# Patient Record
Sex: Female | Born: 1940 | Race: White | Hispanic: No | Marital: Married | Smoking: Never smoker
Health system: Southern US, Community
[De-identification: ages and names within clinical notes are randomized; demographics above are authoritative.]

## PROBLEM LIST (undated history)

## (undated) DIAGNOSIS — I10 Essential (primary) hypertension: Secondary | ICD-10-CM

## (undated) DIAGNOSIS — K08109 Complete loss of teeth, unspecified cause, unspecified class: Secondary | ICD-10-CM

## (undated) DIAGNOSIS — N950 Postmenopausal bleeding: Secondary | ICD-10-CM

## (undated) DIAGNOSIS — Z87410 Personal history of cervical dysplasia: Secondary | ICD-10-CM

## (undated) DIAGNOSIS — Z9221 Personal history of antineoplastic chemotherapy: Secondary | ICD-10-CM

## (undated) DIAGNOSIS — Z923 Personal history of irradiation: Secondary | ICD-10-CM

## (undated) DIAGNOSIS — Z853 Personal history of malignant neoplasm of breast: Secondary | ICD-10-CM

## (undated) DIAGNOSIS — Z972 Presence of dental prosthetic device (complete) (partial): Secondary | ICD-10-CM

## (undated) HISTORY — DX: Personal history of malignant neoplasm of breast: Z85.3

---

## 1963-10-30 HISTORY — PX: CYST REMOVAL NECK: SHX6281

## 1987-10-30 HISTORY — PX: CERVICAL CONE BIOPSY: SUR198

## 1999-09-29 ENCOUNTER — Encounter: Payer: Self-pay | Admitting: Obstetrics and Gynecology

## 1999-09-29 ENCOUNTER — Encounter: Admission: RE | Admit: 1999-09-29 | Discharge: 1999-09-29 | Payer: Self-pay | Admitting: Obstetrics and Gynecology

## 1999-10-05 ENCOUNTER — Encounter: Admission: RE | Admit: 1999-10-05 | Discharge: 1999-10-05 | Payer: Self-pay | Admitting: Obstetrics and Gynecology

## 1999-10-05 ENCOUNTER — Encounter: Payer: Self-pay | Admitting: Obstetrics and Gynecology

## 1999-10-17 ENCOUNTER — Encounter (INDEPENDENT_AMBULATORY_CARE_PROVIDER_SITE_OTHER): Payer: Self-pay | Admitting: Specialist

## 1999-10-17 ENCOUNTER — Encounter: Payer: Self-pay | Admitting: *Deleted

## 1999-10-17 ENCOUNTER — Ambulatory Visit (HOSPITAL_COMMUNITY): Admission: RE | Admit: 1999-10-17 | Discharge: 1999-10-17 | Payer: Self-pay | Admitting: *Deleted

## 1999-10-30 DIAGNOSIS — C50919 Malignant neoplasm of unspecified site of unspecified female breast: Secondary | ICD-10-CM

## 1999-10-30 DIAGNOSIS — Z923 Personal history of irradiation: Secondary | ICD-10-CM

## 1999-10-30 DIAGNOSIS — Z9221 Personal history of antineoplastic chemotherapy: Secondary | ICD-10-CM

## 1999-10-30 HISTORY — DX: Personal history of antineoplastic chemotherapy: Z92.21

## 1999-10-30 HISTORY — PX: BREAST LUMPECTOMY WITH AXILLARY LYMPH NODE DISSECTION: SHX5756

## 1999-10-30 HISTORY — PX: BREAST EXCISIONAL BIOPSY: SUR124

## 1999-10-30 HISTORY — PX: BREAST LUMPECTOMY: SHX2

## 1999-10-30 HISTORY — DX: Personal history of irradiation: Z92.3

## 1999-10-30 HISTORY — DX: Malignant neoplasm of unspecified site of unspecified female breast: C50.919

## 1999-10-31 ENCOUNTER — Encounter (INDEPENDENT_AMBULATORY_CARE_PROVIDER_SITE_OTHER): Payer: Self-pay | Admitting: *Deleted

## 1999-10-31 ENCOUNTER — Ambulatory Visit (HOSPITAL_COMMUNITY): Admission: RE | Admit: 1999-10-31 | Discharge: 1999-10-31 | Payer: Self-pay | Admitting: *Deleted

## 1999-10-31 ENCOUNTER — Encounter: Payer: Self-pay | Admitting: *Deleted

## 1999-11-10 ENCOUNTER — Encounter: Admission: RE | Admit: 1999-11-10 | Discharge: 2000-02-08 | Payer: Self-pay | Admitting: *Deleted

## 1999-11-21 ENCOUNTER — Encounter: Payer: Self-pay | Admitting: Hematology & Oncology

## 1999-11-21 ENCOUNTER — Encounter: Admission: RE | Admit: 1999-11-21 | Discharge: 1999-11-21 | Payer: Self-pay | Admitting: Hematology & Oncology

## 1999-12-04 ENCOUNTER — Encounter: Payer: Self-pay | Admitting: Hematology & Oncology

## 1999-12-04 ENCOUNTER — Emergency Department (HOSPITAL_COMMUNITY): Admission: EM | Admit: 1999-12-04 | Discharge: 1999-12-04 | Payer: Self-pay | Admitting: Emergency Medicine

## 2000-02-12 ENCOUNTER — Encounter: Admission: RE | Admit: 2000-02-12 | Discharge: 2000-05-12 | Payer: Self-pay | Admitting: *Deleted

## 2000-04-29 ENCOUNTER — Encounter: Payer: Self-pay | Admitting: *Deleted

## 2000-04-29 ENCOUNTER — Encounter: Admission: RE | Admit: 2000-04-29 | Discharge: 2000-04-29 | Payer: Self-pay | Admitting: Family Medicine

## 2000-11-01 ENCOUNTER — Encounter: Admission: RE | Admit: 2000-11-01 | Discharge: 2000-11-01 | Payer: Self-pay | Admitting: Hematology & Oncology

## 2000-11-01 ENCOUNTER — Encounter: Payer: Self-pay | Admitting: Hematology & Oncology

## 2001-03-18 ENCOUNTER — Encounter: Admission: RE | Admit: 2001-03-18 | Discharge: 2001-03-18 | Payer: Self-pay | Admitting: *Deleted

## 2001-03-18 ENCOUNTER — Encounter: Payer: Self-pay | Admitting: *Deleted

## 2001-04-04 ENCOUNTER — Encounter (INDEPENDENT_AMBULATORY_CARE_PROVIDER_SITE_OTHER): Payer: Self-pay | Admitting: *Deleted

## 2001-04-04 ENCOUNTER — Ambulatory Visit (HOSPITAL_BASED_OUTPATIENT_CLINIC_OR_DEPARTMENT_OTHER): Admission: RE | Admit: 2001-04-04 | Discharge: 2001-04-04 | Payer: Self-pay | Admitting: *Deleted

## 2001-04-04 HISTORY — PX: OTHER SURGICAL HISTORY: SHX169

## 2001-11-10 ENCOUNTER — Encounter: Payer: Self-pay | Admitting: *Deleted

## 2001-11-10 ENCOUNTER — Encounter: Admission: RE | Admit: 2001-11-10 | Discharge: 2001-11-10 | Payer: Self-pay | Admitting: *Deleted

## 2002-01-09 ENCOUNTER — Ambulatory Visit (HOSPITAL_BASED_OUTPATIENT_CLINIC_OR_DEPARTMENT_OTHER): Admission: RE | Admit: 2002-01-09 | Discharge: 2002-01-09 | Payer: Self-pay | Admitting: *Deleted

## 2002-04-24 ENCOUNTER — Encounter: Payer: Self-pay | Admitting: Hematology & Oncology

## 2002-04-24 ENCOUNTER — Ambulatory Visit (HOSPITAL_COMMUNITY): Admission: RE | Admit: 2002-04-24 | Discharge: 2002-04-24 | Payer: Self-pay | Admitting: Hematology & Oncology

## 2002-11-13 ENCOUNTER — Encounter: Payer: Self-pay | Admitting: *Deleted

## 2002-11-13 ENCOUNTER — Encounter: Admission: RE | Admit: 2002-11-13 | Discharge: 2002-11-13 | Payer: Self-pay | Admitting: *Deleted

## 2003-11-19 ENCOUNTER — Encounter: Admission: RE | Admit: 2003-11-19 | Discharge: 2003-11-19 | Payer: Self-pay | Admitting: Obstetrics and Gynecology

## 2004-11-20 ENCOUNTER — Encounter: Admission: RE | Admit: 2004-11-20 | Discharge: 2004-11-20 | Payer: Self-pay | Admitting: Hematology & Oncology

## 2005-02-02 ENCOUNTER — Ambulatory Visit: Payer: Self-pay | Admitting: Hematology & Oncology

## 2005-08-03 ENCOUNTER — Ambulatory Visit: Payer: Self-pay | Admitting: Hematology & Oncology

## 2005-11-23 ENCOUNTER — Encounter: Admission: RE | Admit: 2005-11-23 | Discharge: 2005-11-23 | Payer: Self-pay | Admitting: Hematology & Oncology

## 2005-12-07 ENCOUNTER — Encounter: Admission: RE | Admit: 2005-12-07 | Discharge: 2005-12-07 | Payer: Self-pay | Admitting: Hematology & Oncology

## 2006-08-01 ENCOUNTER — Ambulatory Visit: Payer: Self-pay | Admitting: Hematology & Oncology

## 2006-12-30 ENCOUNTER — Encounter: Admission: RE | Admit: 2006-12-30 | Discharge: 2006-12-30 | Payer: Self-pay | Admitting: Hematology & Oncology

## 2007-07-31 ENCOUNTER — Ambulatory Visit: Payer: Self-pay | Admitting: Hematology & Oncology

## 2007-08-04 LAB — CBC WITH DIFFERENTIAL/PLATELET
BASO%: 0.5 % (ref 0.0–2.0)
EOS%: 2.2 % (ref 0.0–7.0)
Eosinophils Absolute: 0.1 10*3/uL (ref 0.0–0.5)
HCT: 39.7 % (ref 34.8–46.6)
LYMPH%: 36.8 % (ref 14.0–48.0)
MCHC: 34.6 g/dL (ref 32.0–36.0)
MONO%: 8.5 % (ref 0.0–13.0)
NEUT%: 52 % (ref 39.6–76.8)
Platelets: 263 10*3/uL (ref 145–400)
RBC: 4.13 10*6/uL (ref 3.70–5.32)

## 2007-08-04 LAB — COMPREHENSIVE METABOLIC PANEL
ALT: 16 U/L (ref 0–35)
AST: 16 U/L (ref 0–37)
CO2: 23 mEq/L (ref 19–32)
Chloride: 109 mEq/L (ref 96–112)
Potassium: 4.4 mEq/L (ref 3.5–5.3)
Total Bilirubin: 0.4 mg/dL (ref 0.3–1.2)
Total Protein: 6.9 g/dL (ref 6.0–8.3)

## 2008-01-02 ENCOUNTER — Encounter: Admission: RE | Admit: 2008-01-02 | Discharge: 2008-01-02 | Payer: Self-pay | Admitting: Obstetrics and Gynecology

## 2008-09-16 ENCOUNTER — Ambulatory Visit: Payer: Self-pay | Admitting: Hematology & Oncology

## 2008-09-17 LAB — CBC WITH DIFFERENTIAL (CANCER CENTER ONLY)
BASO%: 0.7 % (ref 0.0–2.0)
EOS%: 2.5 % (ref 0.0–7.0)
HCT: 40.3 % (ref 34.8–46.6)
HGB: 13.7 g/dL (ref 11.6–15.9)
LYMPH%: 37.1 % (ref 14.0–48.0)
MCV: 98 fL (ref 81–101)
MONO%: 7.4 % (ref 0.0–13.0)
NEUT%: 52.3 % (ref 39.6–80.0)
Platelets: 258 10*3/uL (ref 145–400)
RBC: 4.12 10*6/uL (ref 3.70–5.32)
WBC: 4.3 10*3/uL (ref 3.9–10.0)

## 2008-09-17 LAB — COMPREHENSIVE METABOLIC PANEL
Alkaline Phosphatase: 65 U/L (ref 39–117)
CO2: 24 mEq/L (ref 19–32)
Chloride: 107 mEq/L (ref 96–112)
Glucose, Bld: 100 mg/dL — ABNORMAL HIGH (ref 70–99)
Sodium: 140 mEq/L (ref 135–145)

## 2009-01-03 ENCOUNTER — Encounter: Admission: RE | Admit: 2009-01-03 | Discharge: 2009-01-03 | Payer: Self-pay | Admitting: Obstetrics and Gynecology

## 2009-09-15 ENCOUNTER — Ambulatory Visit: Payer: Self-pay | Admitting: Hematology & Oncology

## 2009-09-16 LAB — CBC WITH DIFFERENTIAL (CANCER CENTER ONLY)
BASO#: 0 10*3/uL (ref 0.0–0.2)
BASO%: 0.3 % (ref 0.0–2.0)
LYMPH#: 1.5 10*3/uL (ref 0.9–3.3)
MCH: 32.7 pg (ref 26.0–34.0)
MCHC: 34.1 g/dL (ref 32.0–36.0)
MCV: 96 fL (ref 81–101)
NEUT#: 2.1 10*3/uL (ref 1.5–6.5)
Platelets: 256 10*3/uL (ref 145–400)
WBC: 3.9 10*3/uL (ref 3.9–10.0)

## 2009-09-17 LAB — COMPREHENSIVE METABOLIC PANEL
ALT: 15 U/L (ref 0–35)
Alkaline Phosphatase: 59 U/L (ref 39–117)
Chloride: 108 mEq/L (ref 96–112)
Glucose, Bld: 79 mg/dL (ref 70–99)
Potassium: 4.2 mEq/L (ref 3.5–5.3)

## 2009-09-17 LAB — VITAMIN D 25 HYDROXY (VIT D DEFICIENCY, FRACTURES): Vit D, 25-Hydroxy: 17 ng/mL — ABNORMAL LOW (ref 30–89)

## 2009-09-27 LAB — HM PAP SMEAR: HM Pap smear: NORMAL

## 2010-01-06 ENCOUNTER — Encounter: Admission: RE | Admit: 2010-01-06 | Discharge: 2010-01-06 | Payer: Self-pay | Admitting: Obstetrics and Gynecology

## 2010-09-13 ENCOUNTER — Ambulatory Visit: Payer: Self-pay | Admitting: Hematology & Oncology

## 2010-09-15 LAB — CBC WITH DIFFERENTIAL (CANCER CENTER ONLY)
BASO#: 0 10*3/uL (ref 0.0–0.2)
EOS%: 6.2 % (ref 0.0–7.0)
Eosinophils Absolute: 0.2 10*3/uL (ref 0.0–0.5)
MCH: 32.8 pg (ref 26.0–34.0)
MCV: 98 fL (ref 81–101)
NEUT#: 2.3 10*3/uL (ref 1.5–6.5)
RBC: 4.13 10*6/uL (ref 3.70–5.32)

## 2010-09-16 LAB — COMPREHENSIVE METABOLIC PANEL
CO2: 25 mEq/L (ref 19–32)
Chloride: 106 mEq/L (ref 96–112)
Creatinine, Ser: 0.75 mg/dL (ref 0.40–1.20)
Total Bilirubin: 0.6 mg/dL (ref 0.3–1.2)

## 2010-12-06 ENCOUNTER — Other Ambulatory Visit: Payer: Self-pay | Admitting: Obstetrics and Gynecology

## 2010-12-06 ENCOUNTER — Other Ambulatory Visit: Payer: Self-pay | Admitting: Hematology & Oncology

## 2010-12-06 DIAGNOSIS — Z1231 Encounter for screening mammogram for malignant neoplasm of breast: Secondary | ICD-10-CM

## 2010-12-06 DIAGNOSIS — Z9889 Other specified postprocedural states: Secondary | ICD-10-CM

## 2011-01-12 ENCOUNTER — Ambulatory Visit
Admission: RE | Admit: 2011-01-12 | Discharge: 2011-01-12 | Disposition: A | Discharge status: Home / Self Care | Payer: Medicare HMO | Source: Ambulatory Visit | Attending: Obstetrics and Gynecology | Admitting: Obstetrics and Gynecology

## 2011-01-12 DIAGNOSIS — Z9889 Other specified postprocedural states: Secondary | ICD-10-CM

## 2011-01-12 DIAGNOSIS — Z1231 Encounter for screening mammogram for malignant neoplasm of breast: Secondary | ICD-10-CM

## 2011-01-12 LAB — HM MAMMOGRAPHY: HM Mammogram: NORMAL

## 2011-03-16 NOTE — Consult Note (Signed)
[place]. [facility]  Patient:    ARAEYA LAMB                      MRN: [id number] Proc. Date: 12/05/99 Adm. Date:  [id number] Attending:  Osvaldo Human                          Consultation Report  REASON FOR VISIT: 1. Fever. 2. Neutropenia. 3. Recent chemotherapy for breast cancer.  HISTORY OF PRESENT ILLNESS:  Ms. Dake is a 70 year old white female with breast cancer.  She has a stage I breast cancer.  She recently had chemotherapy two weeks ago.  She got epirubicin/Cytoxan.  She did quite well with chemotherapy.  Over the past day or so, she began to feel a little nauseated.  She had a little bit of a headache.  She had some slight chills.  There were no shakes.  She had no cough or shortness of breath.  She had a sore throat.  She had no actual vomiting. There was no diarrhea.  There was no dysuria.  She had a temperature of 101 on the day of December 04, 1999.  She called the office.  She was called in a prescription for Augmentin.  She started the Augmentin.  She continued to have some temperatures.  Her husband called me this evening and I told her to come to the emergency room.  While in the emergency room, she had a temperature of 101.  PHYSICAL EXAMINATION:  GENERAL:  Relatively unremarkable.  VITAL SIGNS:  Stable.  Blood pressure was 132/78, respirations 16, and her heart rate was 88.  HEENT:  Her throat was slightly erythematous.  There was no exudate.  NECK:  Supple with a good range of motion.  No adenopathies noted in her neck.   LUNGS:  Clear bilaterally.  CARDIAC:  Regular rate and rhythm with no murmurs, rubs, or bruits.  ABDOMEN:  No hepatosplenomegaly.  EXTREMITIES:  No clubbing, cyanosis, or edema.  SKIN:  No rashes, ecchymoses, or petechiae.  NEUROLOGIC:  No focal neurologic deficits.  LABORATORY DATA:  Showed a white cell count of 1500 with an ANC of approximately 600.  Her monocytes, however,  were 29%, which is encouraging.  H&H was stable.   Chest x-ray was negative for pneumonia.  IMPRESSION:  Ms. Wojnar is a 70 year old female with history of breast cancer. he underwent chemotherapy two weeks ago.  She is now mildly neutropenic.  I think we can let her go home.  I think we need to ride out the fever.  The white cells should be recovering real quickly.  I do not think she has a bacterial infection.  This certainly may be a viral process.  I will continue her on the Augmentin.  I will give her some Magic Mouthwash to [facility] for her sore throat, and also give her some Darvocet to take for her headache. DD:  12/05/99 TD:  12/05/99 Job: [id number]

## 2011-03-16 NOTE — Op Note (Signed)
[place]. [facility]  Patient:    Destiny Taylor, Destiny Taylor                     MRN: [id number] Proc. Date: 04/04/01 Adm. Date:  [id number] Attending:  Kandis Mannan CC:         Katherine Roan, M.D.   Operative Report  CCS NUMBER:  [id number]  PREOPERATIVE DIAGNOSIS:   Nipple retraction right breast with history of carcinoma, right breast treated by lumpectomy, radiation and sentinel left node dissection.  POSTOPERATIVE DIAGNOSIS:  Nipple retraction right breast with history of carcinoma, right breast treated by lumpectomy, radiation and sentinel left node dissection.  OPERATION:  Excision of a retroareolar tissue, right breast.  SURGEON:  Maisie Fus B. Samuella Cota, M.D.  ANESTHESIA:  1% Xylocaine local with anesthesia monitoring.  ANESTHESIOLOGIST:  CRNA  DESCRIPTION OF PROCEDURE:  The patient was taken to the operating room and placed on the table in the supine position.  The right breast was prepped and draped in a sterile field.  A circumareolar incision was outlined from about 7 oclock to 11 oclock.  Then 1% Xylocaine local was used to infiltrate the skin and underlying breast tissue.  A curved incision was made and then the tissue beneath the nipple and areola was dissected free from the areola.  The tissue just below the incision and towards the axillary tail was also excised. A small amount of tissue was removed so that the tissue which was adjacent to the undersurface of the nipple was excised and the nipple was no longer inverted.  Bleeding was controlled with the cautery.  No abnormal tissue was felt.  There was no mass in this area.  Subcutaneous tissue was closed with interrupted sutures of 3-0 Vicryl.  The skin was closed with a running subcuticular suture of 4-0 Vicryl reinforced with Benzoin and 1/2 inch Steri-Strips.  A pressure dressing using 4 x 4s, ABD and 4 inch Hypafix was applied.  The specimen was sent fresh to the pathologist. DD:   04/04/01 TD:  04/05/01 Job: [id number]

## 2011-05-10 ENCOUNTER — Encounter: Payer: Self-pay | Admitting: Family Medicine

## 2011-05-10 DIAGNOSIS — Z853 Personal history of malignant neoplasm of breast: Secondary | ICD-10-CM | POA: Insufficient documentation

## 2011-08-31 ENCOUNTER — Ambulatory Visit: Payer: Medicare HMO | Admitting: Gynecology

## 2011-10-04 ENCOUNTER — Telehealth: Payer: Self-pay | Admitting: Hematology & Oncology

## 2011-10-04 NOTE — Telephone Encounter (Signed)
Pt had 2 apts on 12/7 and 12/18.  Pt kept 12/7 apt

## 2011-10-05 ENCOUNTER — Other Ambulatory Visit (HOSPITAL_BASED_OUTPATIENT_CLINIC_OR_DEPARTMENT_OTHER): Payer: Medicare HMO | Admitting: Lab

## 2011-10-05 ENCOUNTER — Ambulatory Visit (HOSPITAL_BASED_OUTPATIENT_CLINIC_OR_DEPARTMENT_OTHER): Payer: Medicare HMO | Admitting: Hematology & Oncology

## 2011-10-05 DIAGNOSIS — M810 Age-related osteoporosis without current pathological fracture: Secondary | ICD-10-CM

## 2011-10-05 DIAGNOSIS — Z853 Personal history of malignant neoplasm of breast: Secondary | ICD-10-CM

## 2011-10-05 LAB — CBC WITH DIFFERENTIAL (CANCER CENTER ONLY)
BASO#: 0 10*3/uL (ref 0.0–0.2)
EOS%: 1.8 % (ref 0.0–7.0)
Eosinophils Absolute: 0.1 10*3/uL (ref 0.0–0.5)
HGB: 13.4 g/dL (ref 11.6–15.9)
LYMPH%: 29.4 % (ref 14.0–48.0)
MCH: 33.6 pg (ref 26.0–34.0)
MCHC: 34.1 g/dL (ref 32.0–36.0)
MONO#: 0.4 10*3/uL (ref 0.1–0.9)
NEUT#: 2.6 10*3/uL (ref 1.5–6.5)
RBC: 3.99 10*6/uL (ref 3.70–5.32)
WBC: 4.4 10*3/uL (ref 3.9–10.0)

## 2011-10-05 LAB — COMPREHENSIVE METABOLIC PANEL
ALT: 13 U/L (ref 0–35)
Albumin: 4.2 g/dL (ref 3.5–5.2)
Alkaline Phosphatase: 70 U/L (ref 39–117)
BUN: 16 mg/dL (ref 6–23)
Chloride: 106 mEq/L (ref 96–112)
Total Protein: 7.2 g/dL (ref 6.0–8.3)

## 2011-10-05 NOTE — Progress Notes (Signed)
This office note has been dictated.

## 2011-10-05 NOTE — Progress Notes (Signed)
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  CURRENT THERAPY:  Observation.  INTERIM HISTORY:  Ms. Destiny Taylor comes in for her yearly followup.  She has had a good year.  Unfortunately, her sister has bladder cancer.  She is being treated at [facility].  She is not doing too well.  Ms. Destiny Taylor and her family did go to [place] in May.  They had a good time down there.  Ms. Destiny Taylor is developing "nodules" on the palms of her hands.  This may be from all of her piano playing.  I suppose that these are probably areas of tendon hypertrophy.  She does not have any contractions of her fingers at this point in time.  Her last mammogram was done back in March.  Everything looked good with the mammogram.  She has had no bony pain.  Her vitamin D was low from what she tells me. She is on 2000 units a day now.  There is no change in bowel or bladder habits.  She has not had any problems with rashes.  When we last saw her, she had a rash.  This resolved.  PHYSICAL EXAM:  General:  This is a well-developed well-nourished white female in no obvious distress.  Vital Signs:  Temperature of 98, pulse 63, respiratory rate 20, blood pressure 148/84.  Weight is 142. Head/Neck:  Exam shows a normocephalic, atraumatic skull.  There are no ocular or oral lesions.  There are no palpable cervical or supraclavicular lymph nodes.  Lungs:  Clear bilaterally.  Cardiac: Regular rate and rhythm with a normal S1, S2.  There are no murmurs, rubs or bruits.  Breasts:  Exam shows the left breast with no masses, edema or erythema.  There is no left axillary adenopathy.  The right breast shows a well-healed lumpectomy at the 10 o'clock position.  There is a slight nipple inversion of the right breast.  No distinct masses noted in the right breast.  There is no right axillary adenopathy. Abdomen:  Soft with good bowel sounds.  There is no palpable abdominal mass.  There is no fluid wave.  There is no  palpable hepatosplenomegaly. Back:  Exam shows no kyphosis.  No tenderness is noted over the spine, ribs, or hips.  Extremities:  No clubbing, cyanosis or edema.  She has had a couple nodules in the palms of her hands.  Again, these appear to be areas of tendon hypertrophy.  LABORATORY STUDIES:  White cell count is 4.4, hemoglobin 13.4, hematocrit 39.3, platelet count 242.  IMPRESSION:  Destiny Taylor is a 70 year old white female with a history of stage I ductal carcinoma of the right breast.  She completed chemotherapy back in March 2001.  She had adjuvant radiation therapy. She then was on Arimidex for 5 years.  I think Ms. Jefferys's risk of recurrence is going to be less than 5%.  She likes to come back to see [place] yearly.  We will get her back to see [place] yearly.  I told Ms. Kilpatrick that if she started to note that her fingers were starting to contract, she likely will need to go to an orthopedic surgeon for evaluation and possibly release of these tendons.    ______________________________ Josph Macho, M.D. PRE/MEDQ  D:  10/05/2011  T:  10/05/2011  Job:  [id number]

## 2011-10-15 ENCOUNTER — Ambulatory Visit: Payer: Medicare HMO | Admitting: Hematology & Oncology

## 2011-10-15 ENCOUNTER — Other Ambulatory Visit: Payer: Medicare HMO | Admitting: Lab

## 2011-10-16 ENCOUNTER — Ambulatory Visit: Payer: Medicare HMO | Admitting: Hematology & Oncology

## 2011-10-16 ENCOUNTER — Other Ambulatory Visit: Payer: Medicare HMO | Admitting: Lab

## 2011-12-07 ENCOUNTER — Other Ambulatory Visit: Payer: Self-pay | Admitting: Obstetrics and Gynecology

## 2011-12-07 DIAGNOSIS — Z1231 Encounter for screening mammogram for malignant neoplasm of breast: Secondary | ICD-10-CM

## 2011-12-10 ENCOUNTER — Other Ambulatory Visit: Payer: Self-pay | Admitting: Hematology & Oncology

## 2011-12-10 DIAGNOSIS — Z1231 Encounter for screening mammogram for malignant neoplasm of breast: Secondary | ICD-10-CM

## 2012-01-14 ENCOUNTER — Ambulatory Visit: Payer: Medicare HMO

## 2012-01-14 ENCOUNTER — Ambulatory Visit
Admission: RE | Admit: 2012-01-14 | Discharge: 2012-01-14 | Disposition: A | Discharge status: Home / Self Care | Payer: Medicare HMO | Source: Ambulatory Visit | Attending: Hematology & Oncology | Admitting: Hematology & Oncology

## 2012-01-14 DIAGNOSIS — Z1231 Encounter for screening mammogram for malignant neoplasm of breast: Secondary | ICD-10-CM

## 2012-02-18 ENCOUNTER — Ambulatory Visit (INDEPENDENT_AMBULATORY_CARE_PROVIDER_SITE_OTHER): Payer: Medicare HMO | Admitting: Gynecology

## 2012-02-18 ENCOUNTER — Encounter: Payer: Self-pay | Admitting: Gynecology

## 2012-02-18 DIAGNOSIS — M810 Age-related osteoporosis without current pathological fracture: Secondary | ICD-10-CM

## 2012-02-18 DIAGNOSIS — N952 Postmenopausal atrophic vaginitis: Secondary | ICD-10-CM

## 2012-02-18 DIAGNOSIS — C50919 Malignant neoplasm of unspecified site of unspecified female breast: Secondary | ICD-10-CM

## 2012-02-18 NOTE — Patient Instructions (Signed)
Follow up for DEXA bone study as scheduled and blood work results. Otherwise follow up in one year for annual follow up exam.

## 2012-02-18 NOTE — Progress Notes (Signed)
Destiny Taylor 08/31/1941 [id number]        71 y.o.  G2 P2 new patient for follow up. Former patient of Dr. Algis Taylor. [facility].  Past medical history,surgical history, medications, allergies, family history and social history were all reviewed and documented in the EPIC chart. ROS:  Was performed and pertinent positives and negatives are included in the history.  Exam: Destiny Taylor chaperone present Filed Vitals:   02/18/12 1350  BP: 120/74   General appearance  Normal Skin grossly normal Head/Neck normal with no cervical or supraclavicular adenopathy thyroid normal Lungs  clear Cardiac RR, without RMG Abdominal  soft, nontender, without masses, organomegaly or hernia Breasts  examined lying and sitting.  Left without masses, retractions, discharge or axillary adenopathy.  Right status post lumpectomy/radiation changes. No acute changes, masses, discharge, adenopathy Pelvic  Ext/BUS/vagina  normal with atrophic genital changes  Cervix  normal  Atrophic changes  Uterus  axial, normal size, shape and contour, midline and mobile nontender   Adnexa  Without masses or tenderness    Anus and perineum  normal   Rectovaginal  normal sphincter tone without palpated masses or tenderness.    Assessment/Plan:  71 y.o. female for annual exam.    1. Breast cancer history. Patient has history of right ductal breast cancer status post lumpectomy/chemotherapy/radiation 2001. Has done well since without evidence of recurrence. She receives annual mammography as follow up. Just had her mammogram in March. We'll continue with annual mammography. SBE monthly reviewed. 2. Pap smear. Last Pap smear 2012. 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 No Pap smear was done today. Options to stop it altogether her doing less frequent screening reviewed and will readdress this on an annual basis. 3. Osteoporosis. Patient has a history of osteoporosis with  last DEXA I saw in 2002 showing a -2.9 T score at the lumbar spine. She gives a history of a DEXA 4 years ago. She is chronically low on her vitamin D and currently is taking 5000 units daily. Had discussed treatment with Dr. Lelon Taylor to include bisphosphonates but she declined. We'll check vitamin D today along with PTH, calcium and repeat DEXA. She has a history of a recent thyroid screen through her primary physician's office which was normal. She'll follow up with me after the DEXA and we'll discuss treatment options. 4. Colonoscopy. Patient is up-to-date with her colon screening and we'll continue to follow up to her primary's office. 5. Health maintenance. No other blood work was done today as it's all done through her primary physician's office. She will see me in follow up of her osteoporosis and otherwise on an annual basis.    Destiny Lords MD, 2:33 PM 02/18/2012

## 2012-02-19 LAB — URINALYSIS W MICROSCOPIC + REFLEX CULTURE
Bacteria, UA: NONE SEEN
Bilirubin Urine: NEGATIVE
Crystals: NONE SEEN
Ketones, ur: NEGATIVE mg/dL
Protein, ur: NEGATIVE mg/dL
Urobilinogen, UA: 0.2 mg/dL (ref 0.0–1.0)

## 2012-02-19 LAB — PTH, INTACT AND CALCIUM
Calcium, Total (PTH): 9.5 mg/dL (ref 8.4–10.5)
PTH: 82.3 pg/mL — ABNORMAL HIGH (ref 14.0–72.0)

## 2012-02-19 NOTE — Progress Notes (Signed)
Addended by: Dara Lords on: 02/19/2012 02:19 PM   Modules accepted: Orders

## 2012-02-21 ENCOUNTER — Telehealth: Payer: Self-pay | Admitting: *Deleted

## 2012-02-21 NOTE — Telephone Encounter (Signed)
(  Pt aware you are out to the office) pt wanted to know you would give rx for tranxene 7.5 mg as needed? Pt said that Dr. Silas [place] gave this to her.

## 2012-02-25 MED ORDER — CLORAZEPATE DIPOTASSIUM 7.5 MG PO TABS: 7.5000 mg | ORAL_TABLET | Freq: Two times a day (BID) | ORAL | Status: DC | PRN

## 2012-02-25 NOTE — Telephone Encounter (Signed)
This is not a medication that I normally prescribed. If she needs it for occasional anxiety use I don't mind giving her a limited prescription but if she is needing it daily then I think this needs to be addressed through her primary physician. I will give her a #30 refill x1 prescription but again if she needs to be using this daily or more frequently than she really needs to discuss this need with her primary.

## 2012-02-25 NOTE — Telephone Encounter (Signed)
LEFT MESSAGE ON PT VOICE REGARDING THE BELOW NOTE.

## 2012-02-27 NOTE — Progress Notes (Signed)
Patient ID: Destiny Taylor, female   DOB: 1941/04/08, 71 y.o.   MRN: [id number] PT. CALLED. NO RX AT PHARMACY. RX PRINTED AT OFFICE SO I CALLED IN CLORAZEPATE 7.5MG . #30 WITH 1 REFILL. SIG:ONE TABLET BY MOUTH 2 TIMES DAILY AS NEEDED FOR ANXIETY PER DR. TF 02-25-12. CALLED TO [facility] AT [facility] [phone].

## 2012-02-28 ENCOUNTER — Encounter: Payer: Self-pay | Admitting: Gynecology

## 2012-03-12 ENCOUNTER — Ambulatory Visit (INDEPENDENT_AMBULATORY_CARE_PROVIDER_SITE_OTHER): Payer: Medicare HMO

## 2012-03-12 ENCOUNTER — Other Ambulatory Visit: Payer: Medicare HMO

## 2012-03-12 DIAGNOSIS — M810 Age-related osteoporosis without current pathological fracture: Secondary | ICD-10-CM

## 2012-03-13 ENCOUNTER — Encounter: Payer: Self-pay | Admitting: Gynecology

## 2012-03-13 ENCOUNTER — Telehealth: Payer: Self-pay | Admitting: Gynecology

## 2012-03-13 LAB — PTH, INTACT AND CALCIUM: Calcium, Total (PTH): 8.9 mg/dL (ref 8.4–10.5)

## 2012-03-13 NOTE — Telephone Encounter (Signed)
Pt informed with the below note, pt will call back to make OV.

## 2012-03-13 NOTE — Telephone Encounter (Signed)
Tell patient that her bone density shows significant osteoporosis and we need to talk about treatment. I am very fearful that she may fall and have a fracture. She needs to schedule an appointment with me.

## 2012-03-27 ENCOUNTER — Encounter: Payer: Self-pay | Admitting: Gynecology

## 2012-10-03 ENCOUNTER — Other Ambulatory Visit: Payer: Medicare HMO | Admitting: Lab

## 2012-10-03 ENCOUNTER — Ambulatory Visit (HOSPITAL_BASED_OUTPATIENT_CLINIC_OR_DEPARTMENT_OTHER): Payer: Medicare HMO | Admitting: Hematology & Oncology

## 2012-10-03 ENCOUNTER — Other Ambulatory Visit: Payer: Self-pay | Admitting: Hematology & Oncology

## 2012-10-03 VITALS — BP 142/63 | HR 61 | Temp 98.0°F | Resp 16 | Wt 143.0 lb

## 2012-10-03 DIAGNOSIS — Z853 Personal history of malignant neoplasm of breast: Secondary | ICD-10-CM

## 2012-10-03 DIAGNOSIS — M810 Age-related osteoporosis without current pathological fracture: Secondary | ICD-10-CM

## 2012-10-03 DIAGNOSIS — C50919 Malignant neoplasm of unspecified site of unspecified female breast: Secondary | ICD-10-CM

## 2012-10-03 LAB — CBC WITH DIFFERENTIAL (CANCER CENTER ONLY)
BASO#: 0 10*3/uL (ref 0.0–0.2)
HCT: 36.5 % (ref 34.8–46.6)
HGB: 12.4 g/dL (ref 11.6–15.9)
LYMPH#: 1.3 10*3/uL (ref 0.9–3.3)
MONO#: 0.4 10*3/uL (ref 0.1–0.9)
NEUT%: 58.7 % (ref 39.6–80.0)
WBC: 4.5 10*3/uL (ref 3.9–10.0)

## 2012-10-03 NOTE — Progress Notes (Signed)
This office note has been dictated.

## 2012-10-04 LAB — COMPREHENSIVE METABOLIC PANEL
BUN: 16 mg/dL (ref 6–23)
CO2: 20 mEq/L (ref 19–32)
Calcium: 9.4 mg/dL (ref 8.4–10.5)
Chloride: 107 mEq/L (ref 96–112)
Creatinine, Ser: 0.81 mg/dL (ref 0.50–1.10)

## 2012-10-04 LAB — VITAMIN D 25 HYDROXY (VIT D DEFICIENCY, FRACTURES): Vit D, 25-Hydroxy: 71 ng/mL (ref 30–89)

## 2012-10-04 NOTE — Progress Notes (Signed)
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  CURRENT THERAPY:  Observation.  INTERIM HISTORY:  Destiny Taylor comes in for followup.  She is doing okay. She has had no problems since we last saw her.  She and her family did go to [place] back in May.  They had a great time.  She has had no issues with her health.  There have been no difficulties with arthritic problems.  When we saw her a year ago, she had some tendon issues with her hands.  There has been no change in her medications.  She continues on aspirin.  I think her last mammogram was done in February.  This did not show any evidence of abnormalities.  She has had no change in bowel or bladder habits.  There have been no rashes.  She has had no leg swelling.  There has been no cough or shortness of breath.  PHYSICAL EXAMINATION:  General:  This is a well-developed, well- nourished white female, in no obvious distress.  Vital signs: Temperature of 98, pulse 61, respiratory rate 16, blood pressure 142/63. Weight is 143.  Head and neck:  Normocephalic, atraumatic skull.  There are no ocular or oral lesions.  There are no palpable cervical or supraclavicular lymph nodes.  Lungs:  Clear bilaterally.  Cardiac: Regular rate and rhythm, with a normal S1, S2.  There are no murmurs, rubs or bruits.  Breasts:  Left breast with no masses, edema or erythema.  There is no left axillary adenopathy.  Right breast shows a slightly contracted breast.  She has a well-healed lumpectomy at the 10 o'clock position.  She has nipple inversion which is chronic.  There is some slight tenderness in the inferior aspect of the breast which is chronic.  Again, no distinct masses noted in the right breast.  There is no right axillary adenopathy.  Abdomen:  Soft, with good bowel sounds. There is no fluid wave.  There is no palpable hepatosplenomegaly.  Back: Shows no kyphosis or osteoporotic  changes.  No tenderness is noted over the spine, ribs, or hips.  Extremities:  Show no clubbing, cyanosis or edema.  Skin:  No rashes, ecchymosis or petechia.  LABORATORY STUDIES:  White cell count is 4.5, hemoglobin 12.4, hematocrit 36.5, platelet count 226,000.  IMPRESSION:  Ms. Lessig is a very nice 71 year old white female with a history of stage I ductal carcinoma of the right breast.  She did receive adjuvant chemotherapy.  This was completed back in March 2001. She received __________Adriamycin/Cytoxan for, I think, 4 cycles.  She had radiation therapy followed by Arimidex.  She had 5 years of Arimidex.  I believe that her risk of recurrence at this point is less than 5%.  We will continue to see her back yearly.  I do not see a need for any x-rays or other studies right now.    ______________________________ Josph Macho, M.D. PRE/MEDQ  D:  10/03/2012  T:  10/04/2012  Job:  [id number]

## 2012-10-08 ENCOUNTER — Telehealth: Payer: Self-pay | Admitting: Oncology

## 2012-10-08 NOTE — Telephone Encounter (Addendum)
Message copied by Lacie Draft on Wed Oct 08, 2012  4:33 PM ------      Message from: Arlan Organ R      Created: Mon Oct 06, 2012  6:44 PM       Call - labs and vit D are great!!!  Pete  Left message with Genevie Cheshire. Teola Bradley, Katheleen Stella [employer]

## 2012-12-15 ENCOUNTER — Other Ambulatory Visit: Payer: Self-pay | Admitting: Hematology & Oncology

## 2012-12-15 DIAGNOSIS — Z1231 Encounter for screening mammogram for malignant neoplasm of breast: Secondary | ICD-10-CM

## 2013-01-16 ENCOUNTER — Ambulatory Visit
Admission: RE | Admit: 2013-01-16 | Discharge: 2013-01-16 | Disposition: A | Discharge status: Home / Self Care | Payer: Medicare HMO | Source: Ambulatory Visit | Attending: Hematology & Oncology | Admitting: Hematology & Oncology

## 2013-01-16 DIAGNOSIS — Z1231 Encounter for screening mammogram for malignant neoplasm of breast: Secondary | ICD-10-CM

## 2013-10-02 ENCOUNTER — Other Ambulatory Visit: Payer: Medicare HMO | Admitting: Lab

## 2013-10-02 ENCOUNTER — Ambulatory Visit: Payer: Medicare HMO | Admitting: Hematology & Oncology

## 2013-10-28 ENCOUNTER — Other Ambulatory Visit: Payer: Self-pay | Admitting: Nurse Practitioner

## 2013-10-28 DIAGNOSIS — Z853 Personal history of malignant neoplasm of breast: Secondary | ICD-10-CM

## 2013-10-30 ENCOUNTER — Ambulatory Visit (HOSPITAL_BASED_OUTPATIENT_CLINIC_OR_DEPARTMENT_OTHER): Payer: Medicare Other | Admitting: Hematology & Oncology

## 2013-10-30 ENCOUNTER — Other Ambulatory Visit (HOSPITAL_BASED_OUTPATIENT_CLINIC_OR_DEPARTMENT_OTHER): Payer: Medicare Other | Admitting: Lab

## 2013-10-30 VITALS — BP 134/58 | HR 67 | Temp 98.1°F | Resp 14 | Ht 67.0 in | Wt 143.0 lb

## 2013-10-30 DIAGNOSIS — Z853 Personal history of malignant neoplasm of breast: Secondary | ICD-10-CM

## 2013-10-30 DIAGNOSIS — C50911 Malignant neoplasm of unspecified site of right female breast: Secondary | ICD-10-CM

## 2013-10-30 LAB — CBC WITH DIFFERENTIAL (CANCER CENTER ONLY)
BASO#: 0 10*3/uL (ref 0.0–0.2)
BASO%: 0.1 % (ref 0.0–2.0)
EOS ABS: 0 10*3/uL (ref 0.0–0.5)
EOS%: 0.3 % (ref 0.0–7.0)
HCT: 38.9 % (ref 34.8–46.6)
HGB: 12.8 g/dL (ref 11.6–15.9)
LYMPH#: 1.4 10*3/uL (ref 0.9–3.3)
LYMPH%: 18.5 % (ref 14.0–48.0)
MCH: 32.8 pg (ref 26.0–34.0)
MCHC: 32.9 g/dL (ref 32.0–36.0)
MCV: 100 fL (ref 81–101)
MONO#: 0.5 10*3/uL (ref 0.1–0.9)
MONO%: 7.3 % (ref 0.0–13.0)
NEUT%: 73.8 % (ref 39.6–80.0)
NEUTROS ABS: 5.5 10*3/uL (ref 1.5–6.5)
PLATELETS: 252 10*3/uL (ref 145–400)
RBC: 3.9 10*6/uL (ref 3.70–5.32)
RDW: 12.4 % (ref 11.1–15.7)
WBC: 7.4 10*3/uL (ref 3.9–10.0)

## 2013-10-30 NOTE — Progress Notes (Signed)
This office note has been dictated.

## 2013-10-31 LAB — COMPREHENSIVE METABOLIC PANEL
ALBUMIN: 3.9 g/dL (ref 3.5–5.2)
ALK PHOS: 57 U/L (ref 39–117)
ALT: 17 U/L (ref 0–35)
AST: 17 U/L (ref 0–37)
BILIRUBIN TOTAL: 0.7 mg/dL (ref 0.3–1.2)
BUN: 15 mg/dL (ref 6–23)
CO2: 24 mEq/L (ref 19–32)
Calcium: 9.5 mg/dL (ref 8.4–10.5)
Chloride: 106 mEq/L (ref 96–112)
Creatinine, Ser: 0.7 mg/dL (ref 0.50–1.10)
Glucose, Bld: 87 mg/dL (ref 70–99)
POTASSIUM: 3.9 meq/L (ref 3.5–5.3)
SODIUM: 140 meq/L (ref 135–145)
TOTAL PROTEIN: 6.7 g/dL (ref 6.0–8.3)

## 2013-10-31 LAB — VITAMIN D 25 HYDROXY (VIT D DEFICIENCY, FRACTURES): Vit D, 25-Hydroxy: 74 ng/mL (ref 30–89)

## 2013-10-31 NOTE — Progress Notes (Signed)
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  CURRENT THERAPY:  Observation.  INTERIM HISTORY:  Ms. Zucco comes in for followup.  This will likely be her last visit to the office.  She is now on 13-14 years from her adjuvant chemotherapy.  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  She was on 5 years of Arimidex.  She has done well in the past year.  She has had no problems herself. She has had no nausea or vomiting.  She did flew up before Christmas. She got over this.  She has had no bony pain.  She has had no cough or shortness of breath. She has had no headache.  Her last mammogram was back in March 2014.  Mammogram looked fine with no suspicious microcalcifications.  When we last saw her a year ago, her lab work looked good.  She is on aspirin.  She is taking vitamin D.  PHYSICAL EXAMINATION:  General:  This is a well-developed, well- nourished white female, in no obvious distress.  Vital Signs: Temperature of 98.1, pulse 67, respiratory rate 14, blood pressure 134/58, weight is 143 pounds.  Head and Neck:  Normocephalic, atraumatic skull.  There are no ocular or oral lesions.  There are no palpable cervical or supraclavicular lymph nodes.  Lungs:  Clear bilaterally. Cardiac:  Regular rate and rhythm with a normal S1 and S2.  There are no murmurs, rubs, or bruits.  Abdomen:  Soft.  She has good bowel sounds. There is no fluid wave.  There is no palpable abdominal mass.  There is no palpable hepatosplenomegaly.  Breasts:  Shows left breast with no masses, edema, or erythema.  There is no left axillary adenopathy.  The right breast shows well-healed lumpectomy at the 10 o'clock position. There is some slight nipple inversion on the right breast, which is chronic.  No distinct mass noted about the right breast.  She has no right axillary adenopathy.  Back:  Shows no kyphosis or osteoporotic changes.  She has no  tenderness over the spine, ribs, or hips. Extremities:  Show no clubbing, cyanosis, or edema.  She has good range motion of her joints.  She has good muscle strength in upper and lower extremities.  Neurological:  No focal neurological deficits.  LABORATORIES STUDIES:  White cell count is 7.4, hemoglobin 12.8, hematocrit 38.9, platelet count 252.  IMPRESSION:  Ms. Borre is a very charming 73 year old white female with a past history of stage I ductal carcinoma of the right breast.  Again, she did receive adjuvant chemotherapy with 4 cycles of Adriamycin/Cytoxan.  She then had 5 years of Arimidex.  Again, she also had radiation therapy.  I think she is cured.  I think the risk of recurrence is going to be less than 5%.  For now, we will just plan to have her come back if necessary.  I still see that we are adding much to her medical care right now.  She certainly knows that she can get in touch with [place] if she has any problems.    ______________________________ Volanda Napoleon, M.D. PRE/MEDQ  D:  10/30/2013  T:  10/31/2013  Job:  [id number]

## 2013-11-02 ENCOUNTER — Telehealth: Payer: Self-pay | Admitting: Nurse Practitioner

## 2013-11-02 NOTE — Telephone Encounter (Addendum)
Message copied by Jimmy Footman on Mon Nov 02, 2013  1:36 PM ------      Message from: Volanda Napoleon      Created: Fri Oct 30, 2013  6:52 PM       Call and let her know that her labs are okay. Thanks. Pete ------Pt verbalized understanding and appreciation.

## 2013-11-03 ENCOUNTER — Telehealth: Payer: Self-pay | Admitting: *Deleted

## 2013-11-03 NOTE — Telephone Encounter (Signed)
Message copied by Rico Ala on Tue Nov 03, 2013  1:08 PM ------      Message from: Burney Gauze R      Created: Mon Nov 02, 2013  6:07 PM       Please call and tell her that her labs and vitamin D are great. Thanks Vernon ------

## 2013-11-03 NOTE — Telephone Encounter (Signed)
Called patient to let her know that her labs and vitamin d levels were great per dr. Marin Olp

## 2013-12-11 ENCOUNTER — Other Ambulatory Visit: Payer: Self-pay

## 2013-12-11 DIAGNOSIS — Z1231 Encounter for screening mammogram for malignant neoplasm of breast: Secondary | ICD-10-CM

## 2014-01-22 ENCOUNTER — Ambulatory Visit
Admission: RE | Admit: 2014-01-22 | Discharge: 2014-01-22 | Disposition: A | Discharge status: Home / Self Care | Payer: Medicare Other | Source: Ambulatory Visit

## 2014-01-22 DIAGNOSIS — Z1231 Encounter for screening mammogram for malignant neoplasm of breast: Secondary | ICD-10-CM

## 2014-08-30 ENCOUNTER — Encounter: Payer: Self-pay | Admitting: Gynecology

## 2014-12-22 ENCOUNTER — Other Ambulatory Visit: Payer: Self-pay

## 2014-12-22 DIAGNOSIS — Z1231 Encounter for screening mammogram for malignant neoplasm of breast: Secondary | ICD-10-CM

## 2015-01-24 ENCOUNTER — Ambulatory Visit
Admission: RE | Admit: 2015-01-24 | Discharge: 2015-01-24 | Disposition: A | Discharge status: Home / Self Care | Payer: Medicare Other | Source: Ambulatory Visit

## 2015-01-24 DIAGNOSIS — Z1231 Encounter for screening mammogram for malignant neoplasm of breast: Secondary | ICD-10-CM

## 2015-08-04 NOTE — H&P (Signed)
NAMEREYNALDA, CANNY NO.:  [id number]  MEDICAL RECORD NO.:  [id number]  LOCATION:                               FACILITY:  [facility]  PHYSICIAN:  Maisie Fus, M.D.   DATE OF BIRTH:  1941-09-02  DATE OF ADMISSION:  08/16/2015 DATE OF DISCHARGE:                             HISTORY & PHYSICAL   Her admission date to outpatient surgery is August 16, 2015.  ADMITTING DIAGNOSES:  Postmenopausal bleeding, endometrial thickening on pelvic ultrasound.  The patient is a 74 year old white married female, gravida 2, para 2, who has been in her usual state of good health until the very recent past when she had an episode of postmenopausal vaginal bleeding.  Pelvic ultrasound in the office showed endometrial thickening with no other pelvic abnormalities.  She is admitted now for hysteroscopy D and C.  PAST MEDICAL HISTORY:  Includes diagnosis of breast cancer with surgery in 2001.  She has had 2 deliveries, one in 27, one in 61.  She has no other known significant medical illnesses except hypertension.  She takes losartan 50 mg a day.  She also takes an 81 mg aspirin.  ALLERGIES:  To CODEINE and INDOCIN.  SOCIAL HISTORY:  She is not a cigarette smoker.  GENERAL REVIEW OF SYSTEMS:  Otherwise negative.  PHYSICAL EXAMINATION:  HEENT:  Grossly within normal limits.  Her thyroid gland is not palpably enlarged.  CHEST:  Clear to auscultation throughout.  HEART:  Normal sinus rhythm without murmurs, rubs, or gallops. ABDOMEN:  Soft and nontender without appreciable organomegaly. PELVIC:  Remarkable only for stenosis of the cervix consistent with her age and menopausal state.  Bimanual reveals a normal size uterus in the anterior position.  There are no adnexal masses.  The rectum is palpably normal and rectovaginal exam confirms the above findings. EXTREMITIES:  Without cyanosis, clubbing, or edema.  ASSESSMENT:  Postmenopausal bleeding in 74 year old female.  PLAN:   Hysteroscopy D and Kandis Fantasia, M.D.     WRN/MEDQ  D:  08/03/2015  T:  08/03/2015  Job:  [id number]

## 2015-08-08 ENCOUNTER — Other Ambulatory Visit: Payer: Self-pay | Admitting: Obstetrics & Gynecology

## 2015-08-09 ENCOUNTER — Encounter (HOSPITAL_BASED_OUTPATIENT_CLINIC_OR_DEPARTMENT_OTHER): Payer: Self-pay | Admitting: *Deleted

## 2015-08-09 NOTE — Progress Notes (Signed)
NPO AFTER MN.  ARRIVE AT 0600.  TO GET LAB WORK DONE Monday 08-15-2015.  NEEDS EKG.  WILL TAKE AM MEDS DOS W/ SIPS OF WATER.

## 2015-08-15 DIAGNOSIS — Z885 Allergy status to narcotic agent status: Secondary | ICD-10-CM | POA: Diagnosis not present

## 2015-08-15 DIAGNOSIS — N882 Stricture and stenosis of cervix uteri: Secondary | ICD-10-CM | POA: Diagnosis not present

## 2015-08-15 DIAGNOSIS — Z853 Personal history of malignant neoplasm of breast: Secondary | ICD-10-CM | POA: Diagnosis not present

## 2015-08-15 DIAGNOSIS — Z888 Allergy status to other drugs, medicaments and biological substances status: Secondary | ICD-10-CM | POA: Diagnosis not present

## 2015-08-15 DIAGNOSIS — I10 Essential (primary) hypertension: Secondary | ICD-10-CM | POA: Diagnosis not present

## 2015-08-15 DIAGNOSIS — M1990 Unspecified osteoarthritis, unspecified site: Secondary | ICD-10-CM | POA: Diagnosis not present

## 2015-08-15 DIAGNOSIS — N950 Postmenopausal bleeding: Secondary | ICD-10-CM | POA: Diagnosis not present

## 2015-08-15 LAB — BASIC METABOLIC PANEL
ANION GAP: 10 (ref 5–15)
BUN: 13 mg/dL (ref 6–20)
CO2: 24 mmol/L (ref 22–32)
Calcium: 9.7 mg/dL (ref 8.9–10.3)
Chloride: 108 mmol/L (ref 101–111)
Creatinine, Ser: 0.81 mg/dL (ref 0.44–1.00)
GFR calc Af Amer: >60 mL/min (ref >60)
GFR calc non Af Amer: >60 mL/min (ref >60)
GLUCOSE: 91 mg/dL (ref 65–99)
POTASSIUM: 4.2 mmol/L (ref 3.5–5.1)
Sodium: 142 mmol/L (ref 135–145)

## 2015-08-15 LAB — CBC
HEMATOCRIT: 43.9 % (ref 36.0–46.0)
Hemoglobin: 14.5 g/dL (ref 12.0–15.0)
MCH: 33.4 pg (ref 26.0–34.0)
MCHC: 33 g/dL (ref 30.0–36.0)
MCV: 101.2 fL — AB (ref 78.0–100.0)
Platelets: 242 10*3/uL (ref 150–400)
RBC: 4.34 MIL/uL (ref 3.87–5.11)
RDW: 12.4 % (ref 11.5–15.5)
WBC: 6.2 10*3/uL (ref 4.0–10.5)

## 2015-08-16 ENCOUNTER — Encounter (HOSPITAL_BASED_OUTPATIENT_CLINIC_OR_DEPARTMENT_OTHER)
Admission: RE | Disposition: A | Discharge status: Home / Self Care | Payer: Self-pay | Source: Ambulatory Visit | Attending: Obstetrics & Gynecology

## 2015-08-16 ENCOUNTER — Ambulatory Visit (HOSPITAL_BASED_OUTPATIENT_CLINIC_OR_DEPARTMENT_OTHER): Payer: Medicare Other | Admitting: Anesthesiology

## 2015-08-16 ENCOUNTER — Encounter (HOSPITAL_BASED_OUTPATIENT_CLINIC_OR_DEPARTMENT_OTHER): Payer: Self-pay

## 2015-08-16 ENCOUNTER — Ambulatory Visit (HOSPITAL_BASED_OUTPATIENT_CLINIC_OR_DEPARTMENT_OTHER)
Admission: RE | Admit: 2015-08-16 | Discharge: 2015-08-16 | Disposition: A | Discharge status: Home / Self Care | Payer: Medicare Other | Source: Ambulatory Visit | Attending: Obstetrics & Gynecology | Admitting: Obstetrics & Gynecology

## 2015-08-16 DIAGNOSIS — N950 Postmenopausal bleeding: Secondary | ICD-10-CM | POA: Insufficient documentation

## 2015-08-16 DIAGNOSIS — Z853 Personal history of malignant neoplasm of breast: Secondary | ICD-10-CM | POA: Insufficient documentation

## 2015-08-16 DIAGNOSIS — I10 Essential (primary) hypertension: Secondary | ICD-10-CM | POA: Diagnosis not present

## 2015-08-16 DIAGNOSIS — N882 Stricture and stenosis of cervix uteri: Secondary | ICD-10-CM | POA: Insufficient documentation

## 2015-08-16 DIAGNOSIS — M1990 Unspecified osteoarthritis, unspecified site: Secondary | ICD-10-CM | POA: Diagnosis not present

## 2015-08-16 DIAGNOSIS — Z885 Allergy status to narcotic agent status: Secondary | ICD-10-CM | POA: Insufficient documentation

## 2015-08-16 DIAGNOSIS — Z888 Allergy status to other drugs, medicaments and biological substances status: Secondary | ICD-10-CM | POA: Insufficient documentation

## 2015-08-16 HISTORY — DX: Complete loss of teeth, unspecified cause, unspecified class: K08.109

## 2015-08-16 HISTORY — DX: Personal history of cervical dysplasia: Z87.410

## 2015-08-16 HISTORY — PX: HYSTEROSCOPY WITH D & C: SHX1775

## 2015-08-16 HISTORY — DX: Essential (primary) hypertension: I10

## 2015-08-16 HISTORY — DX: Presence of dental prosthetic device (complete) (partial): Z97.2

## 2015-08-16 HISTORY — DX: Postmenopausal bleeding: N95.0

## 2015-08-16 SURGERY — DILATATION AND CURETTAGE /HYSTEROSCOPY
Anesthesia: General | Site: Uterus

## 2015-08-16 MED ORDER — FENTANYL CITRATE (PF) 100 MCG/2ML IJ SOLN
25.0000 ug | INTRAMUSCULAR | Status: DC | PRN
  Filled 2015-08-16: qty 1

## 2015-08-16 MED ORDER — CEFAZOLIN SODIUM-DEXTROSE 2-3 GM-% IV SOLR
INTRAVENOUS | Status: AC
  Filled 2015-08-16: qty 50

## 2015-08-16 MED ORDER — FENTANYL CITRATE (PF) 100 MCG/2ML IJ SOLN
INTRAMUSCULAR | Status: AC
  Filled 2015-08-16: qty 4

## 2015-08-16 MED ORDER — SODIUM CHLORIDE 0.9 % IR SOLN
Status: DC | PRN
  Administered 2015-08-16: 3000 mL

## 2015-08-16 MED ORDER — ONDANSETRON HCL 4 MG/2ML IJ SOLN
INTRAMUSCULAR | Status: DC | PRN
  Administered 2015-08-16: 4 mg via INTRAVENOUS

## 2015-08-16 MED ORDER — PROPOFOL 10 MG/ML IV BOLUS
INTRAVENOUS | Status: DC | PRN
  Administered 2015-08-16: 120 mg via INTRAVENOUS

## 2015-08-16 MED ORDER — FENTANYL CITRATE (PF) 100 MCG/2ML IJ SOLN
INTRAMUSCULAR | Status: DC | PRN
  Administered 2015-08-16 (×2): 50 ug via INTRAVENOUS

## 2015-08-16 MED ORDER — KETOROLAC TROMETHAMINE 30 MG/ML IJ SOLN
INTRAMUSCULAR | Status: DC | PRN
  Administered 2015-08-16: 30 mg via INTRAVENOUS

## 2015-08-16 MED ORDER — LACTATED RINGERS IV SOLN
INTRAVENOUS | Status: DC
  Administered 2015-08-16: 07:00:00 via INTRAVENOUS
  Filled 2015-08-16: qty 1000

## 2015-08-16 MED ORDER — DEXAMETHASONE SODIUM PHOSPHATE 4 MG/ML IJ SOLN
INTRAMUSCULAR | Status: DC | PRN
  Administered 2015-08-16: 10 mg via INTRAVENOUS

## 2015-08-16 MED ORDER — LIDOCAINE HCL (CARDIAC) 20 MG/ML IV SOLN
INTRAVENOUS | Status: DC | PRN
  Administered 2015-08-16: 75 mg via INTRAVENOUS

## 2015-08-16 MED ORDER — CEFAZOLIN SODIUM-DEXTROSE 2-3 GM-% IV SOLR
2.0000 g | INTRAVENOUS | Status: AC
  Administered 2015-08-16: 2 g via INTRAVENOUS
  Filled 2015-08-16: qty 50

## 2015-08-16 SURGICAL SUPPLY — 23 items
ABLATOR ENDOMETRIAL BIPOLAR (ABLATOR) IMPLANT
CANISTER SUCTION 2500CC (MISCELLANEOUS) ×3 IMPLANT
CATH ROBINSON RED A/P 16FR (CATHETERS) ×3 IMPLANT
CLOTH BEACON ORANGE TIMEOUT ST (SAFETY) ×3 IMPLANT
COVER BACK TABLE 60X90IN (DRAPES) ×3 IMPLANT
DRAPE HYSTEROSCOPY (DRAPE) ×2 IMPLANT
DRAPE LG THREE QUARTER DISP (DRAPES) ×3 IMPLANT
ELECT REM PT RETURN 9FT ADLT (ELECTROSURGICAL)
ELECTRODE REM PT RTRN 9FT ADLT (ELECTROSURGICAL) IMPLANT
GLOVE BIO SURGEON STRL SZ7.5 (GLOVE) ×5 IMPLANT
GOWN STRL REUS W/ TWL LRG LVL3 (GOWN DISPOSABLE) ×1 IMPLANT
GOWN STRL REUS W/ TWL XL LVL3 (GOWN DISPOSABLE) ×2 IMPLANT
GOWN STRL REUS W/TWL LRG LVL3 (GOWN DISPOSABLE) ×3
GOWN STRL REUS W/TWL XL LVL3 (GOWN DISPOSABLE) ×3
KIT ROOM TURNOVER WOR (KITS) ×4 IMPLANT
LEGGING LITHOTOMY PAIR STRL (DRAPES) ×3 IMPLANT
LOOP ANGLED CUTTING 22FR (CUTTING LOOP) IMPLANT
PACK BASIN DAY SURGERY FS (CUSTOM PROCEDURE TRAY) ×2 IMPLANT
PAD OB MATERNITY 4.3X12.25 (PERSONAL CARE ITEMS) ×3 IMPLANT
TOWEL OR 17X24 6PK STRL BLUE (TOWEL DISPOSABLE) ×6 IMPLANT
TRAY DSU PREP LF (CUSTOM PROCEDURE TRAY) ×3 IMPLANT
TUBING AQUILEX INFLOW (TUBING) ×2 IMPLANT
TUBING AQUILEX OUTFLOW (TUBING) ×2 IMPLANT

## 2015-08-16 NOTE — Anesthesia Postprocedure Evaluation (Signed)
  Anesthesia Post-op Note  Patient: Destiny Taylor  Procedure(s) Performed: Procedure(s): DILATATION AND CURETTAGE /HYSTEROSCOPY (N/A)  Patient Location: PACU  Anesthesia Type:General  Level of Consciousness: awake  Airway and Oxygen Therapy: Patient Spontanous Breathing  Post-op Pain: mild  Post-op Assessment: Post-op Vital signs reviewed              Post-op Vital Signs: Reviewed  Last Vitals:  Filed Vitals:   08/16/15 1025  BP: 129/68  Pulse: 56  Temp: 36.4 C  Resp: 16    Complications: No apparent anesthesia complications

## 2015-08-16 NOTE — Discharge Instructions (Signed)
°  Post Anesthesia Home Care Instructions  Activity: Get plenty of rest for the remainder of the day. A responsible adult should stay with you for 24 hours following the procedure.  For the next 24 hours, DO NOT: -Drive a car -Operate machinery -Drink alcoholic beverages -Take any medication unless instructed by your physician -Make any legal decisions or sign important papers.  Meals: Start with liquid foods such as gelatin or soup. Progress to regular foods as tolerated. Avoid greasy, spicy, heavy foods. If nausea and/or vomiting occur, drink only clear liquids until the nausea and/or vomiting subsides. Call your physician if vomiting continues.  Special Instructions/Symptoms: Your throat may feel dry or sore from the anesthesia or the breathing tube placed in your throat during surgery. If this causes discomfort, gargle with warm salt water. The discomfort should disappear within 24 hours.  If you had a scopolamine patch placed behind your ear for the management of post- operative nausea and/or vomiting:  1. The medication in the patch is effective for 72 hours, after which it should be removed.  Wrap patch in a tissue and discard in the trash. Wash hands thoroughly with soap and water. 2. You may remove the patch earlier than 72 hours if you experience unpleasant side effects which may include dry mouth, dizziness or visual disturbances. 3. Avoid touching the patch. Wash your hands with soap and water after contact with the patch.     D & C Home care Instructions:   Personal hygiene:  Used sanitary napkins for vaginal drainage not tampons. Shower or tub bathe the day after your procedure. No douching until bleeding stops. Always wipe from front to back after  Elimination.  Activity: Do not drive or operate any equipment today. The effects of the anesthesia are still present and drowsiness may result. Rest today, not necessarily flat bed rest, just take it easy. You may resume your  normal activity in one to 2 days.  Sexual activity: No intercourse for one week or as indicated by your physician  Diet: Eat a light diet as desired this evening. You may resume a regular diet tomorrow.  Return to work: One to 2 days.  General Expectations of your surgery: Vaginal bleeding should be no heavier than a normal period. Spotting may continue up to 10 days. Mild cramps may continue for a couple of days. You may have a regular period in 2-6 weeks.  Unexpected observations call your doctor if these occur: persistent or heavy bleeding. Severe abdominal cramping or pain. Elevation of temperature greater than 100F.  Call for an appointment in one week.  

## 2015-08-16 NOTE — Anesthesia Procedure Notes (Signed)
Procedure Name: LMA Insertion Date/Time: 08/16/2015 8:08 AM Performed by: Wanita Chamberlain Pre-anesthesia Checklist: Patient identified, Timeout performed, Emergency Drugs available, Suction available and Patient being monitored Patient Re-evaluated:Patient Re-evaluated prior to inductionOxygen Delivery Method: Circle system utilized Preoxygenation: Pre-oxygenation with 100% oxygen Intubation Type: IV induction Ventilation: Mask ventilation without difficulty LMA: LMA inserted LMA Size: 4.0 Number of attempts: 1 Airway Equipment and Method: Bite block Placement Confirmation: breath sounds checked- equal and bilateral and positive ETCO2 Tube secured with: Tape Dental Injury: Teeth and Oropharynx as per pre-operative assessment

## 2015-08-16 NOTE — Anesthesia Preprocedure Evaluation (Addendum)
Anesthesia Evaluation  Patient identified by MRN, date of birth, ID band Patient awake    Reviewed: Allergy & Precautions, NPO status   Airway Mallampati: II  TM Distance: >3 FB Neck ROM: Full    Dental  (+) Edentulous Upper, Edentulous Lower   Pulmonary neg pulmonary ROS,    breath sounds clear to auscultation       Cardiovascular hypertension, Pt. on medications  Rhythm:Regular Rate:Normal  16-Aug-2015  Sinus bradycardia with Premature atrial complexes    Neuro/Psych negative neurological ROS  negative psych ROS   GI/Hepatic negative GI ROS, Neg liver ROS,   Endo/Other  negative endocrine ROS  Renal/GU negative Renal ROS     Musculoskeletal  (+) Arthritis , Osteoarthritis,    Abdominal   Peds  Hematology negative hematology ROS (+)   Anesthesia Other Findings Hx right breast CA w/ lumpectomy 2001  Reproductive/Obstetrics negative OB ROS                         Anesthesia Physical Anesthesia Plan  ASA: III  Anesthesia Plan: General   Post-op Pain Management:    Induction: Intravenous  Airway Management Planned: LMA  Additional Equipment:   Intra-op Plan:   Post-operative Plan: Extubation in OR  Informed Consent: I have reviewed the patients History and Physical, chart, labs and discussed the procedure including the risks, benefits and alternatives for the proposed anesthesia with the patient or authorized representative who has indicated his/her understanding and acceptance.   Dental advisory given  Plan Discussed with: Anesthesiologist and CRNA  Anesthesia Plan Comments:         Anesthesia Quick Evaluation

## 2015-08-16 NOTE — Transfer of Care (Signed)
Immediate Anesthesia Transfer of Care Note  Patient: Destiny Taylor  Procedure(s) Performed: Procedure(s): DILATATION AND CURETTAGE /HYSTEROSCOPY (N/A)  Patient Location: PACU  Anesthesia Type:General  Level of Consciousness: awake, alert , oriented and patient cooperative  Airway & Oxygen Therapy: Patient Spontanous Breathing and Patient connected to nasal cannula oxygen  Post-op Assessment: Report given to RN and Post -op Vital signs reviewed and stable  Post vital signs: Reviewed and stable  Last Vitals:  Filed Vitals:   08/16/15 0614  BP: 125/64  Pulse: 50  Temp: 36.4 C  Resp: 16    Complications: No apparent anesthesia complications

## 2015-08-17 ENCOUNTER — Encounter (HOSPITAL_BASED_OUTPATIENT_CLINIC_OR_DEPARTMENT_OTHER): Payer: Self-pay | Admitting: Obstetrics & Gynecology

## 2015-08-17 NOTE — Op Note (Signed)
[name], Destiny Taylor NO.:  [id number]  MEDICAL RECORD NO.:  [id number]  LOCATION:                               FACILITY:  [facility]  PHYSICIAN:  Maisie Fus, M.D.   DATE OF BIRTH:  January 20, 1941  DATE OF PROCEDURE:  08/16/2015 DATE OF DISCHARGE:  08/16/2015                              OPERATIVE REPORT   PREOPERATIVE DIAGNOSES:  Postmenopausal bleeding, endometrial thickening on pelvic ultrasound in the office.  POSTOPERATIVE DIAGNOSES:  Postmenopausal bleeding, endometrial thickening on pelvic ultrasound in the office with no other abnormal findings.  OPERATIVE PROCEDURE:  Hysteroscopy, D and C.  SECONDARY DIAGNOSIS:  Marked cervical stenosis.  ANESTHESIA:  General.  ESTIMATED INTRAOPERATIVE BLOOD LOSS:  Less than 10 mL.  INTRAOPERATIVE COMPLICATIONS:  None.  Technical difficulty was experienced in dilating the cervix.  Details of the present illness are recorded in the admission note.  The patient was admitted on the morning of surgery.  She was placed in PAS hose.  She was given 2 g bolus of Ancef.  She was taken to the operating room, placed under general anesthesia, placed in dorsal lithotomy position using the [facility] stirrup system.  Betadine prep of mons, perineum, and vagina was carried out in the usual fashion.  Sterile drapes were applied.  Bivalve speculum was introduced.  Cervix was grasped on the anterior lip with a single-tooth tenaculum.  Cervix was stenotic and closed.  The initial application of the flexible os binder was unsuccessful.  Ultimately we used pediatric dilators to successfully dilate the cervix.  It was then further dilated with Pratt's to 23.  The hysteroscope was then introduced and the cavity visualized and photographed.  No visible abnormalities were noted.  Thorough curettage was then carried out using an endocervical type curette because of marked stenosis of the cervix.  Tissue was recovered with Cain Sieve forceps.  Specimen was submitted for examination.  After re-inspection confirmed complete sampling of the endometrium, the procedure was terminated.  The instruments were removed.  The patient was given 30 mg of Toradol IV.  She was awakened, taken to recovery room in good condition.  She will be given routine outpatient surgical instructions and is to follow up in the office in approximately 1 week.     Maisie Fus, M.D.     WRN/MEDQ  D:  08/16/2015  T:  08/16/2015  Job:  [id number]

## 2015-12-16 ENCOUNTER — Other Ambulatory Visit: Payer: Self-pay

## 2015-12-16 DIAGNOSIS — Z1231 Encounter for screening mammogram for malignant neoplasm of breast: Secondary | ICD-10-CM

## 2016-01-26 ENCOUNTER — Ambulatory Visit: Payer: Medicare Other

## 2016-01-27 ENCOUNTER — Ambulatory Visit
Admission: RE | Admit: 2016-01-27 | Discharge: 2016-01-27 | Disposition: A | Discharge status: Home / Self Care | Payer: Medicare Other | Source: Ambulatory Visit

## 2016-01-27 DIAGNOSIS — Z79899 Other long term (current) drug therapy: Secondary | ICD-10-CM | POA: Diagnosis not present

## 2016-01-27 DIAGNOSIS — Z1231 Encounter for screening mammogram for malignant neoplasm of breast: Secondary | ICD-10-CM

## 2016-01-27 DIAGNOSIS — E559 Vitamin D deficiency, unspecified: Secondary | ICD-10-CM | POA: Diagnosis not present

## 2016-01-27 DIAGNOSIS — E785 Hyperlipidemia, unspecified: Secondary | ICD-10-CM | POA: Diagnosis not present

## 2016-01-30 DIAGNOSIS — I10 Essential (primary) hypertension: Secondary | ICD-10-CM | POA: Diagnosis not present

## 2016-01-30 DIAGNOSIS — Z1389 Encounter for screening for other disorder: Secondary | ICD-10-CM | POA: Diagnosis not present

## 2016-01-30 DIAGNOSIS — K5909 Other constipation: Secondary | ICD-10-CM | POA: Diagnosis not present

## 2016-05-26 DIAGNOSIS — M62838 Other muscle spasm: Secondary | ICD-10-CM | POA: Diagnosis not present

## 2016-05-31 DIAGNOSIS — M436 Torticollis: Secondary | ICD-10-CM | POA: Diagnosis not present

## 2016-06-28 DIAGNOSIS — R309 Painful micturition, unspecified: Secondary | ICD-10-CM | POA: Diagnosis not present

## 2016-06-28 DIAGNOSIS — Z01419 Encounter for gynecological examination (general) (routine) without abnormal findings: Secondary | ICD-10-CM | POA: Diagnosis not present

## 2016-06-28 DIAGNOSIS — Z6821 Body mass index (BMI) 21.0-21.9, adult: Secondary | ICD-10-CM | POA: Diagnosis not present

## 2016-06-28 DIAGNOSIS — N390 Urinary tract infection, site not specified: Secondary | ICD-10-CM | POA: Diagnosis not present

## 2016-07-05 DIAGNOSIS — N390 Urinary tract infection, site not specified: Secondary | ICD-10-CM | POA: Diagnosis not present

## 2016-08-06 DIAGNOSIS — K5909 Other constipation: Secondary | ICD-10-CM | POA: Diagnosis not present

## 2016-08-06 DIAGNOSIS — I10 Essential (primary) hypertension: Secondary | ICD-10-CM | POA: Diagnosis not present

## 2016-08-06 DIAGNOSIS — Z0000 Encounter for general adult medical examination without abnormal findings: Secondary | ICD-10-CM | POA: Diagnosis not present

## 2016-08-06 DIAGNOSIS — Z9181 History of falling: Secondary | ICD-10-CM | POA: Diagnosis not present

## 2016-08-06 DIAGNOSIS — Z23 Encounter for immunization: Secondary | ICD-10-CM | POA: Diagnosis not present

## 2016-08-20 DIAGNOSIS — Z23 Encounter for immunization: Secondary | ICD-10-CM | POA: Diagnosis not present

## 2016-09-27 DIAGNOSIS — H43813 Vitreous degeneration, bilateral: Secondary | ICD-10-CM | POA: Diagnosis not present

## 2016-09-27 DIAGNOSIS — H26493 Other secondary cataract, bilateral: Secondary | ICD-10-CM | POA: Diagnosis not present

## 2016-10-30 DIAGNOSIS — Z6822 Body mass index (BMI) 22.0-22.9, adult: Secondary | ICD-10-CM | POA: Diagnosis not present

## 2016-10-30 DIAGNOSIS — J329 Chronic sinusitis, unspecified: Secondary | ICD-10-CM | POA: Diagnosis not present

## 2016-12-06 DIAGNOSIS — Z6822 Body mass index (BMI) 22.0-22.9, adult: Secondary | ICD-10-CM | POA: Diagnosis not present

## 2016-12-06 DIAGNOSIS — I10 Essential (primary) hypertension: Secondary | ICD-10-CM | POA: Diagnosis not present

## 2016-12-06 DIAGNOSIS — E785 Hyperlipidemia, unspecified: Secondary | ICD-10-CM | POA: Diagnosis not present

## 2016-12-06 DIAGNOSIS — K5909 Other constipation: Secondary | ICD-10-CM | POA: Diagnosis not present

## 2016-12-07 DIAGNOSIS — E559 Vitamin D deficiency, unspecified: Secondary | ICD-10-CM | POA: Diagnosis not present

## 2016-12-07 DIAGNOSIS — Z79899 Other long term (current) drug therapy: Secondary | ICD-10-CM | POA: Diagnosis not present

## 2016-12-07 DIAGNOSIS — E785 Hyperlipidemia, unspecified: Secondary | ICD-10-CM | POA: Diagnosis not present

## 2016-12-20 ENCOUNTER — Other Ambulatory Visit: Payer: Self-pay | Admitting: Internal Medicine

## 2016-12-20 DIAGNOSIS — Z1231 Encounter for screening mammogram for malignant neoplasm of breast: Secondary | ICD-10-CM

## 2017-01-29 ENCOUNTER — Ambulatory Visit
Admission: RE | Admit: 2017-01-29 | Discharge: 2017-01-29 | Disposition: A | Discharge status: Home / Self Care | Payer: Medicare Other | Source: Ambulatory Visit | Attending: Internal Medicine | Admitting: Internal Medicine

## 2017-01-29 DIAGNOSIS — Z1231 Encounter for screening mammogram for malignant neoplasm of breast: Secondary | ICD-10-CM | POA: Diagnosis not present

## 2017-01-29 HISTORY — DX: Personal history of irradiation: Z92.3

## 2017-01-29 HISTORY — DX: Personal history of antineoplastic chemotherapy: Z92.21

## 2017-03-01 DIAGNOSIS — H00015 Hordeolum externum left lower eyelid: Secondary | ICD-10-CM | POA: Diagnosis not present

## 2017-03-02 DIAGNOSIS — L821 Other seborrheic keratosis: Secondary | ICD-10-CM | POA: Diagnosis not present

## 2017-03-02 DIAGNOSIS — L570 Actinic keratosis: Secondary | ICD-10-CM | POA: Diagnosis not present

## 2017-04-08 DIAGNOSIS — Z1389 Encounter for screening for other disorder: Secondary | ICD-10-CM | POA: Diagnosis not present

## 2017-04-08 DIAGNOSIS — E785 Hyperlipidemia, unspecified: Secondary | ICD-10-CM | POA: Diagnosis not present

## 2017-04-08 DIAGNOSIS — Z9181 History of falling: Secondary | ICD-10-CM | POA: Diagnosis not present

## 2017-04-08 DIAGNOSIS — I10 Essential (primary) hypertension: Secondary | ICD-10-CM | POA: Diagnosis not present

## 2017-04-08 DIAGNOSIS — Z139 Encounter for screening, unspecified: Secondary | ICD-10-CM | POA: Diagnosis not present

## 2017-04-08 DIAGNOSIS — Z79899 Other long term (current) drug therapy: Secondary | ICD-10-CM | POA: Diagnosis not present

## 2017-07-02 DIAGNOSIS — Z6822 Body mass index (BMI) 22.0-22.9, adult: Secondary | ICD-10-CM | POA: Diagnosis not present

## 2017-07-02 DIAGNOSIS — Z23 Encounter for immunization: Secondary | ICD-10-CM | POA: Diagnosis not present

## 2017-07-02 DIAGNOSIS — S81812A Laceration without foreign body, left lower leg, initial encounter: Secondary | ICD-10-CM | POA: Diagnosis not present

## 2017-07-04 DIAGNOSIS — Z01419 Encounter for gynecological examination (general) (routine) without abnormal findings: Secondary | ICD-10-CM | POA: Diagnosis not present

## 2017-07-04 DIAGNOSIS — Z6822 Body mass index (BMI) 22.0-22.9, adult: Secondary | ICD-10-CM | POA: Diagnosis not present

## 2017-08-08 DIAGNOSIS — Z6823 Body mass index (BMI) 23.0-23.9, adult: Secondary | ICD-10-CM | POA: Diagnosis not present

## 2017-08-08 DIAGNOSIS — I10 Essential (primary) hypertension: Secondary | ICD-10-CM | POA: Diagnosis not present

## 2017-08-08 DIAGNOSIS — Z79899 Other long term (current) drug therapy: Secondary | ICD-10-CM | POA: Diagnosis not present

## 2017-08-08 DIAGNOSIS — Z23 Encounter for immunization: Secondary | ICD-10-CM | POA: Diagnosis not present

## 2017-08-08 DIAGNOSIS — E559 Vitamin D deficiency, unspecified: Secondary | ICD-10-CM | POA: Diagnosis not present

## 2017-08-08 DIAGNOSIS — Z9481 Bone marrow transplant status: Secondary | ICD-10-CM | POA: Diagnosis not present

## 2017-08-08 DIAGNOSIS — E785 Hyperlipidemia, unspecified: Secondary | ICD-10-CM | POA: Diagnosis not present

## 2017-10-11 DIAGNOSIS — H26493 Other secondary cataract, bilateral: Secondary | ICD-10-CM | POA: Diagnosis not present

## 2017-10-11 DIAGNOSIS — H43813 Vitreous degeneration, bilateral: Secondary | ICD-10-CM | POA: Diagnosis not present

## 2017-10-16 DIAGNOSIS — Z6823 Body mass index (BMI) 23.0-23.9, adult: Secondary | ICD-10-CM | POA: Diagnosis not present

## 2017-10-16 DIAGNOSIS — N3000 Acute cystitis without hematuria: Secondary | ICD-10-CM | POA: Diagnosis not present

## 2017-12-16 DIAGNOSIS — E559 Vitamin D deficiency, unspecified: Secondary | ICD-10-CM | POA: Diagnosis not present

## 2017-12-16 DIAGNOSIS — I10 Essential (primary) hypertension: Secondary | ICD-10-CM | POA: Diagnosis not present

## 2017-12-16 DIAGNOSIS — Z6823 Body mass index (BMI) 23.0-23.9, adult: Secondary | ICD-10-CM | POA: Diagnosis not present

## 2017-12-16 DIAGNOSIS — E785 Hyperlipidemia, unspecified: Secondary | ICD-10-CM | POA: Diagnosis not present

## 2017-12-16 DIAGNOSIS — Z79899 Other long term (current) drug therapy: Secondary | ICD-10-CM | POA: Diagnosis not present

## 2017-12-19 ENCOUNTER — Other Ambulatory Visit: Payer: Self-pay | Admitting: Internal Medicine

## 2017-12-19 DIAGNOSIS — Z139 Encounter for screening, unspecified: Secondary | ICD-10-CM

## 2018-01-03 DIAGNOSIS — R945 Abnormal results of liver function studies: Secondary | ICD-10-CM | POA: Diagnosis not present

## 2018-01-03 DIAGNOSIS — Z9181 History of falling: Secondary | ICD-10-CM | POA: Diagnosis not present

## 2018-01-03 DIAGNOSIS — E785 Hyperlipidemia, unspecified: Secondary | ICD-10-CM | POA: Diagnosis not present

## 2018-01-03 DIAGNOSIS — Z1331 Encounter for screening for depression: Secondary | ICD-10-CM | POA: Diagnosis not present

## 2018-01-03 DIAGNOSIS — Z1231 Encounter for screening mammogram for malignant neoplasm of breast: Secondary | ICD-10-CM | POA: Diagnosis not present

## 2018-01-03 DIAGNOSIS — Z0000 Encounter for general adult medical examination without abnormal findings: Secondary | ICD-10-CM | POA: Diagnosis not present

## 2018-01-07 ENCOUNTER — Other Ambulatory Visit: Payer: Self-pay | Admitting: Internal Medicine

## 2018-01-07 DIAGNOSIS — E2839 Other primary ovarian failure: Secondary | ICD-10-CM

## 2018-01-30 ENCOUNTER — Ambulatory Visit: Payer: Medicare Other

## 2018-02-04 ENCOUNTER — Ambulatory Visit
Admission: RE | Admit: 2018-02-04 | Discharge: 2018-02-04 | Disposition: A | Discharge status: Home / Self Care | Payer: Medicare Other | Source: Ambulatory Visit | Attending: Internal Medicine | Admitting: Internal Medicine

## 2018-02-04 DIAGNOSIS — E2839 Other primary ovarian failure: Secondary | ICD-10-CM

## 2018-02-04 DIAGNOSIS — Z780 Asymptomatic menopausal state: Secondary | ICD-10-CM | POA: Diagnosis not present

## 2018-02-04 DIAGNOSIS — Z1231 Encounter for screening mammogram for malignant neoplasm of breast: Secondary | ICD-10-CM | POA: Diagnosis not present

## 2018-02-04 DIAGNOSIS — Z139 Encounter for screening, unspecified: Secondary | ICD-10-CM

## 2018-02-04 DIAGNOSIS — M810 Age-related osteoporosis without current pathological fracture: Secondary | ICD-10-CM | POA: Diagnosis not present

## 2018-04-15 DIAGNOSIS — Z79899 Other long term (current) drug therapy: Secondary | ICD-10-CM | POA: Diagnosis not present

## 2018-04-15 DIAGNOSIS — M810 Age-related osteoporosis without current pathological fracture: Secondary | ICD-10-CM | POA: Diagnosis not present

## 2018-04-15 DIAGNOSIS — Z6822 Body mass index (BMI) 22.0-22.9, adult: Secondary | ICD-10-CM | POA: Diagnosis not present

## 2018-04-15 DIAGNOSIS — E785 Hyperlipidemia, unspecified: Secondary | ICD-10-CM | POA: Diagnosis not present

## 2018-04-15 DIAGNOSIS — Z1339 Encounter for screening examination for other mental health and behavioral disorders: Secondary | ICD-10-CM | POA: Diagnosis not present

## 2018-04-15 DIAGNOSIS — E559 Vitamin D deficiency, unspecified: Secondary | ICD-10-CM | POA: Diagnosis not present

## 2018-04-15 DIAGNOSIS — I10 Essential (primary) hypertension: Secondary | ICD-10-CM | POA: Diagnosis not present

## 2018-04-15 DIAGNOSIS — R945 Abnormal results of liver function studies: Secondary | ICD-10-CM | POA: Diagnosis not present

## 2018-06-30 DIAGNOSIS — S61412A Laceration without foreign body of left hand, initial encounter: Secondary | ICD-10-CM | POA: Diagnosis not present

## 2018-06-30 DIAGNOSIS — Z23 Encounter for immunization: Secondary | ICD-10-CM | POA: Diagnosis not present

## 2018-06-30 DIAGNOSIS — W19XXXA Unspecified fall, initial encounter: Secondary | ICD-10-CM | POA: Diagnosis not present

## 2018-06-30 DIAGNOSIS — S42291A Other displaced fracture of upper end of right humerus, initial encounter for closed fracture: Secondary | ICD-10-CM | POA: Diagnosis not present

## 2018-06-30 DIAGNOSIS — S42122A Displaced fracture of acromial process, left shoulder, initial encounter for closed fracture: Secondary | ICD-10-CM | POA: Diagnosis not present

## 2018-06-30 DIAGNOSIS — S43016A Anterior dislocation of unspecified humerus, initial encounter: Secondary | ICD-10-CM | POA: Diagnosis not present

## 2018-06-30 DIAGNOSIS — S42202A Unspecified fracture of upper end of left humerus, initial encounter for closed fracture: Secondary | ICD-10-CM | POA: Diagnosis not present

## 2018-06-30 DIAGNOSIS — S42292A Other displaced fracture of upper end of left humerus, initial encounter for closed fracture: Secondary | ICD-10-CM | POA: Diagnosis not present

## 2018-06-30 DIAGNOSIS — S6992XA Unspecified injury of left wrist, hand and finger(s), initial encounter: Secondary | ICD-10-CM | POA: Diagnosis not present

## 2018-06-30 DIAGNOSIS — S43015A Anterior dislocation of left humerus, initial encounter: Secondary | ICD-10-CM | POA: Diagnosis not present

## 2018-06-30 DIAGNOSIS — S42252A Displaced fracture of greater tuberosity of left humerus, initial encounter for closed fracture: Secondary | ICD-10-CM | POA: Diagnosis not present

## 2018-07-03 DIAGNOSIS — S43015A Anterior dislocation of left humerus, initial encounter: Secondary | ICD-10-CM | POA: Diagnosis not present

## 2018-07-03 DIAGNOSIS — S42255A Nondisplaced fracture of greater tuberosity of left humerus, initial encounter for closed fracture: Secondary | ICD-10-CM | POA: Diagnosis not present

## 2018-07-07 DIAGNOSIS — Z6822 Body mass index (BMI) 22.0-22.9, adult: Secondary | ICD-10-CM | POA: Diagnosis not present

## 2018-07-09 DIAGNOSIS — S42255A Nondisplaced fracture of greater tuberosity of left humerus, initial encounter for closed fracture: Secondary | ICD-10-CM | POA: Diagnosis not present

## 2018-07-09 DIAGNOSIS — S43015A Anterior dislocation of left humerus, initial encounter: Secondary | ICD-10-CM | POA: Diagnosis not present

## 2018-07-16 DIAGNOSIS — S42255D Nondisplaced fracture of greater tuberosity of left humerus, subsequent encounter for fracture with routine healing: Secondary | ICD-10-CM | POA: Diagnosis not present

## 2018-07-16 DIAGNOSIS — S43015D Anterior dislocation of left humerus, subsequent encounter: Secondary | ICD-10-CM | POA: Diagnosis not present

## 2018-07-22 DIAGNOSIS — M810 Age-related osteoporosis without current pathological fracture: Secondary | ICD-10-CM | POA: Diagnosis not present

## 2018-07-22 DIAGNOSIS — Z23 Encounter for immunization: Secondary | ICD-10-CM | POA: Diagnosis not present

## 2018-07-22 DIAGNOSIS — E785 Hyperlipidemia, unspecified: Secondary | ICD-10-CM | POA: Diagnosis not present

## 2018-07-22 DIAGNOSIS — I10 Essential (primary) hypertension: Secondary | ICD-10-CM | POA: Diagnosis not present

## 2018-07-22 DIAGNOSIS — Z6822 Body mass index (BMI) 22.0-22.9, adult: Secondary | ICD-10-CM | POA: Diagnosis not present

## 2018-07-22 DIAGNOSIS — R945 Abnormal results of liver function studies: Secondary | ICD-10-CM | POA: Diagnosis not present

## 2018-08-11 DIAGNOSIS — S43015D Anterior dislocation of left humerus, subsequent encounter: Secondary | ICD-10-CM | POA: Diagnosis not present

## 2018-08-11 DIAGNOSIS — S42255D Nondisplaced fracture of greater tuberosity of left humerus, subsequent encounter for fracture with routine healing: Secondary | ICD-10-CM | POA: Diagnosis not present

## 2018-09-08 DIAGNOSIS — S42255D Nondisplaced fracture of greater tuberosity of left humerus, subsequent encounter for fracture with routine healing: Secondary | ICD-10-CM | POA: Diagnosis not present

## 2018-09-08 DIAGNOSIS — S43015D Anterior dislocation of left humerus, subsequent encounter: Secondary | ICD-10-CM | POA: Diagnosis not present

## 2018-09-09 DIAGNOSIS — M25512 Pain in left shoulder: Secondary | ICD-10-CM | POA: Diagnosis not present

## 2018-09-09 DIAGNOSIS — M25612 Stiffness of left shoulder, not elsewhere classified: Secondary | ICD-10-CM | POA: Diagnosis not present

## 2018-09-12 DIAGNOSIS — M25612 Stiffness of left shoulder, not elsewhere classified: Secondary | ICD-10-CM | POA: Diagnosis not present

## 2018-09-12 DIAGNOSIS — M25512 Pain in left shoulder: Secondary | ICD-10-CM | POA: Diagnosis not present

## 2018-09-15 DIAGNOSIS — M25612 Stiffness of left shoulder, not elsewhere classified: Secondary | ICD-10-CM | POA: Diagnosis not present

## 2018-09-15 DIAGNOSIS — M25512 Pain in left shoulder: Secondary | ICD-10-CM | POA: Diagnosis not present

## 2018-09-19 DIAGNOSIS — M25512 Pain in left shoulder: Secondary | ICD-10-CM | POA: Diagnosis not present

## 2018-09-19 DIAGNOSIS — M25612 Stiffness of left shoulder, not elsewhere classified: Secondary | ICD-10-CM | POA: Diagnosis not present

## 2018-09-22 DIAGNOSIS — M25612 Stiffness of left shoulder, not elsewhere classified: Secondary | ICD-10-CM | POA: Diagnosis not present

## 2018-09-22 DIAGNOSIS — M25512 Pain in left shoulder: Secondary | ICD-10-CM | POA: Diagnosis not present

## 2018-09-26 DIAGNOSIS — M25612 Stiffness of left shoulder, not elsewhere classified: Secondary | ICD-10-CM | POA: Diagnosis not present

## 2018-09-26 DIAGNOSIS — M25512 Pain in left shoulder: Secondary | ICD-10-CM | POA: Diagnosis not present

## 2018-09-29 DIAGNOSIS — M25612 Stiffness of left shoulder, not elsewhere classified: Secondary | ICD-10-CM | POA: Diagnosis not present

## 2018-09-29 DIAGNOSIS — M25512 Pain in left shoulder: Secondary | ICD-10-CM | POA: Diagnosis not present

## 2018-10-03 DIAGNOSIS — M25612 Stiffness of left shoulder, not elsewhere classified: Secondary | ICD-10-CM | POA: Diagnosis not present

## 2018-10-03 DIAGNOSIS — M25512 Pain in left shoulder: Secondary | ICD-10-CM | POA: Diagnosis not present

## 2018-10-06 DIAGNOSIS — M7502 Adhesive capsulitis of left shoulder: Secondary | ICD-10-CM | POA: Diagnosis not present

## 2018-10-06 DIAGNOSIS — M25612 Stiffness of left shoulder, not elsewhere classified: Secondary | ICD-10-CM | POA: Diagnosis not present

## 2018-10-06 DIAGNOSIS — S42255D Nondisplaced fracture of greater tuberosity of left humerus, subsequent encounter for fracture with routine healing: Secondary | ICD-10-CM | POA: Diagnosis not present

## 2018-10-06 DIAGNOSIS — S43015D Anterior dislocation of left humerus, subsequent encounter: Secondary | ICD-10-CM | POA: Diagnosis not present

## 2018-10-06 DIAGNOSIS — M25512 Pain in left shoulder: Secondary | ICD-10-CM | POA: Diagnosis not present

## 2018-10-10 DIAGNOSIS — M25512 Pain in left shoulder: Secondary | ICD-10-CM | POA: Diagnosis not present

## 2018-10-10 DIAGNOSIS — M25612 Stiffness of left shoulder, not elsewhere classified: Secondary | ICD-10-CM | POA: Diagnosis not present

## 2018-10-13 DIAGNOSIS — M25612 Stiffness of left shoulder, not elsewhere classified: Secondary | ICD-10-CM | POA: Diagnosis not present

## 2018-10-13 DIAGNOSIS — M25512 Pain in left shoulder: Secondary | ICD-10-CM | POA: Diagnosis not present

## 2018-10-17 DIAGNOSIS — M25512 Pain in left shoulder: Secondary | ICD-10-CM | POA: Diagnosis not present

## 2018-10-17 DIAGNOSIS — M25612 Stiffness of left shoulder, not elsewhere classified: Secondary | ICD-10-CM | POA: Diagnosis not present

## 2018-10-20 DIAGNOSIS — M25612 Stiffness of left shoulder, not elsewhere classified: Secondary | ICD-10-CM | POA: Diagnosis not present

## 2018-10-20 DIAGNOSIS — M25512 Pain in left shoulder: Secondary | ICD-10-CM | POA: Diagnosis not present

## 2018-10-24 DIAGNOSIS — M25612 Stiffness of left shoulder, not elsewhere classified: Secondary | ICD-10-CM | POA: Diagnosis not present

## 2018-10-24 DIAGNOSIS — M25512 Pain in left shoulder: Secondary | ICD-10-CM | POA: Diagnosis not present

## 2018-10-27 DIAGNOSIS — M25512 Pain in left shoulder: Secondary | ICD-10-CM | POA: Diagnosis not present

## 2018-10-27 DIAGNOSIS — M25612 Stiffness of left shoulder, not elsewhere classified: Secondary | ICD-10-CM | POA: Diagnosis not present

## 2018-10-31 DIAGNOSIS — M25612 Stiffness of left shoulder, not elsewhere classified: Secondary | ICD-10-CM | POA: Diagnosis not present

## 2018-10-31 DIAGNOSIS — M25512 Pain in left shoulder: Secondary | ICD-10-CM | POA: Diagnosis not present

## 2018-11-03 DIAGNOSIS — M25612 Stiffness of left shoulder, not elsewhere classified: Secondary | ICD-10-CM | POA: Diagnosis not present

## 2018-11-03 DIAGNOSIS — S42255D Nondisplaced fracture of greater tuberosity of left humerus, subsequent encounter for fracture with routine healing: Secondary | ICD-10-CM | POA: Diagnosis not present

## 2018-11-03 DIAGNOSIS — M7502 Adhesive capsulitis of left shoulder: Secondary | ICD-10-CM | POA: Diagnosis not present

## 2018-11-03 DIAGNOSIS — S43015D Anterior dislocation of left humerus, subsequent encounter: Secondary | ICD-10-CM | POA: Diagnosis not present

## 2018-11-03 DIAGNOSIS — M25512 Pain in left shoulder: Secondary | ICD-10-CM | POA: Diagnosis not present

## 2018-11-06 DIAGNOSIS — H26493 Other secondary cataract, bilateral: Secondary | ICD-10-CM | POA: Diagnosis not present

## 2018-11-06 DIAGNOSIS — H43813 Vitreous degeneration, bilateral: Secondary | ICD-10-CM | POA: Diagnosis not present

## 2018-11-07 DIAGNOSIS — M25512 Pain in left shoulder: Secondary | ICD-10-CM | POA: Diagnosis not present

## 2018-11-07 DIAGNOSIS — M25612 Stiffness of left shoulder, not elsewhere classified: Secondary | ICD-10-CM | POA: Diagnosis not present

## 2018-11-10 DIAGNOSIS — M25612 Stiffness of left shoulder, not elsewhere classified: Secondary | ICD-10-CM | POA: Diagnosis not present

## 2018-11-10 DIAGNOSIS — M25512 Pain in left shoulder: Secondary | ICD-10-CM | POA: Diagnosis not present

## 2018-11-14 DIAGNOSIS — M25512 Pain in left shoulder: Secondary | ICD-10-CM | POA: Diagnosis not present

## 2018-11-14 DIAGNOSIS — M25612 Stiffness of left shoulder, not elsewhere classified: Secondary | ICD-10-CM | POA: Diagnosis not present

## 2018-11-17 DIAGNOSIS — M25612 Stiffness of left shoulder, not elsewhere classified: Secondary | ICD-10-CM | POA: Diagnosis not present

## 2018-11-17 DIAGNOSIS — M25512 Pain in left shoulder: Secondary | ICD-10-CM | POA: Diagnosis not present

## 2018-11-19 DIAGNOSIS — M62512 Muscle wasting and atrophy, not elsewhere classified, left shoulder: Secondary | ICD-10-CM | POA: Diagnosis not present

## 2018-11-21 DIAGNOSIS — M25512 Pain in left shoulder: Secondary | ICD-10-CM | POA: Diagnosis not present

## 2018-11-21 DIAGNOSIS — M25612 Stiffness of left shoulder, not elsewhere classified: Secondary | ICD-10-CM | POA: Diagnosis not present

## 2018-11-24 DIAGNOSIS — Z79899 Other long term (current) drug therapy: Secondary | ICD-10-CM | POA: Diagnosis not present

## 2018-11-24 DIAGNOSIS — H6122 Impacted cerumen, left ear: Secondary | ICD-10-CM | POA: Diagnosis not present

## 2018-11-24 DIAGNOSIS — Z139 Encounter for screening, unspecified: Secondary | ICD-10-CM | POA: Diagnosis not present

## 2018-11-24 DIAGNOSIS — M25512 Pain in left shoulder: Secondary | ICD-10-CM | POA: Diagnosis not present

## 2018-11-24 DIAGNOSIS — R945 Abnormal results of liver function studies: Secondary | ICD-10-CM | POA: Diagnosis not present

## 2018-11-24 DIAGNOSIS — Z6822 Body mass index (BMI) 22.0-22.9, adult: Secondary | ICD-10-CM | POA: Diagnosis not present

## 2018-11-24 DIAGNOSIS — I10 Essential (primary) hypertension: Secondary | ICD-10-CM | POA: Diagnosis not present

## 2018-11-24 DIAGNOSIS — M25612 Stiffness of left shoulder, not elsewhere classified: Secondary | ICD-10-CM | POA: Diagnosis not present

## 2018-11-25 DIAGNOSIS — L820 Inflamed seborrheic keratosis: Secondary | ICD-10-CM | POA: Diagnosis not present

## 2018-11-25 DIAGNOSIS — L821 Other seborrheic keratosis: Secondary | ICD-10-CM | POA: Diagnosis not present

## 2018-11-25 DIAGNOSIS — D1801 Hemangioma of skin and subcutaneous tissue: Secondary | ICD-10-CM | POA: Diagnosis not present

## 2018-11-25 DIAGNOSIS — L570 Actinic keratosis: Secondary | ICD-10-CM | POA: Diagnosis not present

## 2018-11-25 DIAGNOSIS — L578 Other skin changes due to chronic exposure to nonionizing radiation: Secondary | ICD-10-CM | POA: Diagnosis not present

## 2018-12-20 DIAGNOSIS — M62512 Muscle wasting and atrophy, not elsewhere classified, left shoulder: Secondary | ICD-10-CM | POA: Diagnosis not present

## 2018-12-25 ENCOUNTER — Other Ambulatory Visit: Payer: Self-pay | Admitting: Internal Medicine

## 2018-12-25 DIAGNOSIS — Z1231 Encounter for screening mammogram for malignant neoplasm of breast: Secondary | ICD-10-CM

## 2019-01-09 ENCOUNTER — Other Ambulatory Visit: Payer: Self-pay | Admitting: Internal Medicine

## 2019-01-09 DIAGNOSIS — N959 Unspecified menopausal and perimenopausal disorder: Secondary | ICD-10-CM | POA: Diagnosis not present

## 2019-01-09 DIAGNOSIS — Z1231 Encounter for screening mammogram for malignant neoplasm of breast: Secondary | ICD-10-CM | POA: Diagnosis not present

## 2019-01-09 DIAGNOSIS — E785 Hyperlipidemia, unspecified: Secondary | ICD-10-CM | POA: Diagnosis not present

## 2019-01-09 DIAGNOSIS — Z9181 History of falling: Secondary | ICD-10-CM | POA: Diagnosis not present

## 2019-01-09 DIAGNOSIS — Z0000 Encounter for general adult medical examination without abnormal findings: Secondary | ICD-10-CM | POA: Diagnosis not present

## 2019-01-18 DIAGNOSIS — M62512 Muscle wasting and atrophy, not elsewhere classified, left shoulder: Secondary | ICD-10-CM | POA: Diagnosis not present

## 2019-02-09 ENCOUNTER — Ambulatory Visit: Payer: Medicare Other

## 2019-02-18 DIAGNOSIS — M62512 Muscle wasting and atrophy, not elsewhere classified, left shoulder: Secondary | ICD-10-CM | POA: Diagnosis not present

## 2019-03-16 ENCOUNTER — Ambulatory Visit: Payer: Medicare Other

## 2019-03-20 DIAGNOSIS — M62512 Muscle wasting and atrophy, not elsewhere classified, left shoulder: Secondary | ICD-10-CM | POA: Diagnosis not present

## 2019-03-26 DIAGNOSIS — I10 Essential (primary) hypertension: Secondary | ICD-10-CM | POA: Diagnosis not present

## 2019-03-26 DIAGNOSIS — M810 Age-related osteoporosis without current pathological fracture: Secondary | ICD-10-CM | POA: Diagnosis not present

## 2019-03-26 DIAGNOSIS — E559 Vitamin D deficiency, unspecified: Secondary | ICD-10-CM | POA: Diagnosis not present

## 2019-03-26 DIAGNOSIS — E785 Hyperlipidemia, unspecified: Secondary | ICD-10-CM | POA: Diagnosis not present

## 2019-03-26 DIAGNOSIS — K5909 Other constipation: Secondary | ICD-10-CM | POA: Diagnosis not present

## 2019-04-27 ENCOUNTER — Ambulatory Visit
Admission: RE | Admit: 2019-04-27 | Discharge: 2019-04-27 | Disposition: A | Discharge status: Home / Self Care | Payer: Medicare Other | Source: Ambulatory Visit | Attending: Internal Medicine | Admitting: Internal Medicine

## 2019-04-27 ENCOUNTER — Other Ambulatory Visit: Payer: Self-pay

## 2019-04-27 DIAGNOSIS — Z1231 Encounter for screening mammogram for malignant neoplasm of breast: Secondary | ICD-10-CM

## 2019-05-25 DIAGNOSIS — Z79899 Other long term (current) drug therapy: Secondary | ICD-10-CM | POA: Diagnosis not present

## 2019-05-25 DIAGNOSIS — E785 Hyperlipidemia, unspecified: Secondary | ICD-10-CM | POA: Diagnosis not present

## 2019-05-25 DIAGNOSIS — E559 Vitamin D deficiency, unspecified: Secondary | ICD-10-CM | POA: Diagnosis not present

## 2019-05-25 DIAGNOSIS — M436 Torticollis: Secondary | ICD-10-CM | POA: Diagnosis not present

## 2019-06-24 DIAGNOSIS — E785 Hyperlipidemia, unspecified: Secondary | ICD-10-CM | POA: Diagnosis not present

## 2019-06-24 DIAGNOSIS — M436 Torticollis: Secondary | ICD-10-CM | POA: Diagnosis not present

## 2019-06-24 DIAGNOSIS — I10 Essential (primary) hypertension: Secondary | ICD-10-CM | POA: Diagnosis not present

## 2019-06-24 DIAGNOSIS — M810 Age-related osteoporosis without current pathological fracture: Secondary | ICD-10-CM | POA: Diagnosis not present

## 2019-06-24 DIAGNOSIS — Z6821 Body mass index (BMI) 21.0-21.9, adult: Secondary | ICD-10-CM | POA: Diagnosis not present

## 2019-06-25 DIAGNOSIS — Z79899 Other long term (current) drug therapy: Secondary | ICD-10-CM | POA: Diagnosis not present

## 2019-06-25 DIAGNOSIS — I10 Essential (primary) hypertension: Secondary | ICD-10-CM | POA: Diagnosis not present

## 2019-06-25 DIAGNOSIS — E559 Vitamin D deficiency, unspecified: Secondary | ICD-10-CM | POA: Diagnosis not present

## 2019-06-25 DIAGNOSIS — E785 Hyperlipidemia, unspecified: Secondary | ICD-10-CM | POA: Diagnosis not present

## 2019-07-16 DIAGNOSIS — Z124 Encounter for screening for malignant neoplasm of cervix: Secondary | ICD-10-CM | POA: Diagnosis not present

## 2019-07-16 DIAGNOSIS — Z6821 Body mass index (BMI) 21.0-21.9, adult: Secondary | ICD-10-CM | POA: Diagnosis not present

## 2019-07-21 DIAGNOSIS — L820 Inflamed seborrheic keratosis: Secondary | ICD-10-CM | POA: Diagnosis not present

## 2019-08-08 DIAGNOSIS — Z23 Encounter for immunization: Secondary | ICD-10-CM | POA: Diagnosis not present

## 2019-09-16 DIAGNOSIS — M810 Age-related osteoporosis without current pathological fracture: Secondary | ICD-10-CM | POA: Diagnosis not present

## 2019-09-16 DIAGNOSIS — I10 Essential (primary) hypertension: Secondary | ICD-10-CM | POA: Diagnosis not present

## 2019-09-16 DIAGNOSIS — K5909 Other constipation: Secondary | ICD-10-CM | POA: Diagnosis not present

## 2019-09-16 DIAGNOSIS — E785 Hyperlipidemia, unspecified: Secondary | ICD-10-CM | POA: Diagnosis not present

## 2019-09-16 DIAGNOSIS — Z6821 Body mass index (BMI) 21.0-21.9, adult: Secondary | ICD-10-CM | POA: Diagnosis not present

## 2019-09-18 DIAGNOSIS — R945 Abnormal results of liver function studies: Secondary | ICD-10-CM | POA: Diagnosis not present

## 2019-10-07 DIAGNOSIS — L820 Inflamed seborrheic keratosis: Secondary | ICD-10-CM | POA: Diagnosis not present

## 2019-12-15 DIAGNOSIS — M810 Age-related osteoporosis without current pathological fracture: Secondary | ICD-10-CM | POA: Diagnosis not present

## 2019-12-15 DIAGNOSIS — C50919 Malignant neoplasm of unspecified site of unspecified female breast: Secondary | ICD-10-CM | POA: Diagnosis not present

## 2019-12-15 DIAGNOSIS — I10 Essential (primary) hypertension: Secondary | ICD-10-CM | POA: Diagnosis not present

## 2019-12-23 DIAGNOSIS — L820 Inflamed seborrheic keratosis: Secondary | ICD-10-CM | POA: Diagnosis not present

## 2020-03-18 ENCOUNTER — Other Ambulatory Visit: Payer: Self-pay | Admitting: Internal Medicine

## 2020-03-18 DIAGNOSIS — Z1231 Encounter for screening mammogram for malignant neoplasm of breast: Secondary | ICD-10-CM

## 2020-04-27 ENCOUNTER — Ambulatory Visit
Admission: RE | Admit: 2020-04-27 | Discharge: 2020-04-27 | Disposition: A | Discharge status: Home / Self Care | Payer: Medicare Other | Source: Ambulatory Visit | Attending: Internal Medicine | Admitting: Internal Medicine

## 2020-04-27 ENCOUNTER — Other Ambulatory Visit: Payer: Self-pay

## 2020-04-27 DIAGNOSIS — Z1231 Encounter for screening mammogram for malignant neoplasm of breast: Secondary | ICD-10-CM

## 2021-03-20 ENCOUNTER — Other Ambulatory Visit: Payer: Self-pay | Admitting: Internal Medicine

## 2021-03-20 DIAGNOSIS — Z1231 Encounter for screening mammogram for malignant neoplasm of breast: Secondary | ICD-10-CM

## 2021-05-16 ENCOUNTER — Other Ambulatory Visit: Payer: Self-pay

## 2021-05-16 ENCOUNTER — Ambulatory Visit
Admission: RE | Admit: 2021-05-16 | Discharge: 2021-05-16 | Disposition: A | Discharge status: Home / Self Care | Payer: Medicare Other | Source: Ambulatory Visit | Attending: Internal Medicine | Admitting: Internal Medicine

## 2021-05-16 DIAGNOSIS — Z1231 Encounter for screening mammogram for malignant neoplasm of breast: Secondary | ICD-10-CM

## 2022-04-04 ENCOUNTER — Other Ambulatory Visit: Payer: Self-pay | Admitting: Internal Medicine

## 2022-04-04 DIAGNOSIS — Z1231 Encounter for screening mammogram for malignant neoplasm of breast: Secondary | ICD-10-CM

## 2022-05-17 ENCOUNTER — Ambulatory Visit
Admission: RE | Admit: 2022-05-17 | Discharge: 2022-05-17 | Disposition: A | Discharge status: Home / Self Care | Payer: Medicare Other | Source: Ambulatory Visit | Attending: Internal Medicine | Admitting: Internal Medicine

## 2022-05-17 DIAGNOSIS — Z1231 Encounter for screening mammogram for malignant neoplasm of breast: Secondary | ICD-10-CM

## 2022-09-23 IMAGING — MG MM DIGITAL SCREENING BILAT W/ TOMO AND CAD
6 of 10 series · 6 of 30 positions shown · non-contrast
Comparison: Previous exam(s).

CLINICAL DATA: Screening.

EXAM:
DIGITAL SCREENING BILATERAL MAMMOGRAM WITH TOMOSYNTHESIS AND CAD
TECHNIQUE: Bilateral screening digital craniocaudal and mediolateral oblique
mammograms were obtained. Bilateral screening digital breast
tomosynthesis was performed. The images were evaluated with
computer-aided detection.

[R MLO synth-2D]
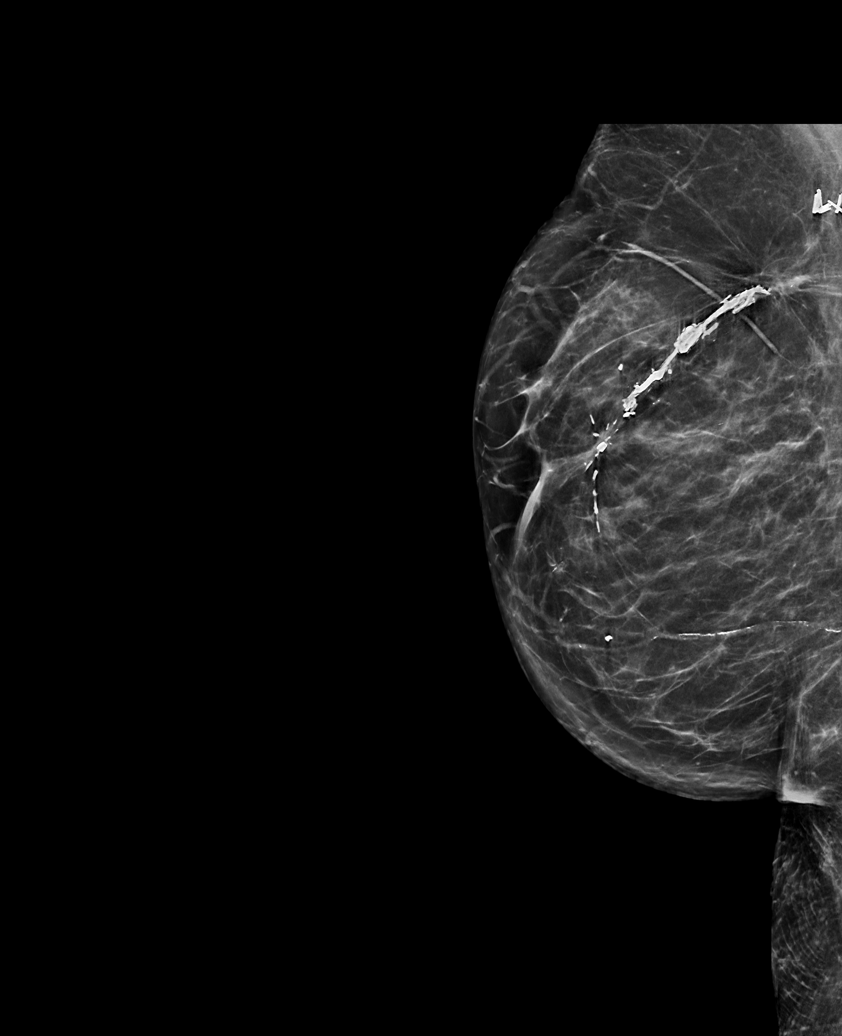

[L CC synth-2D (1 of 2)]
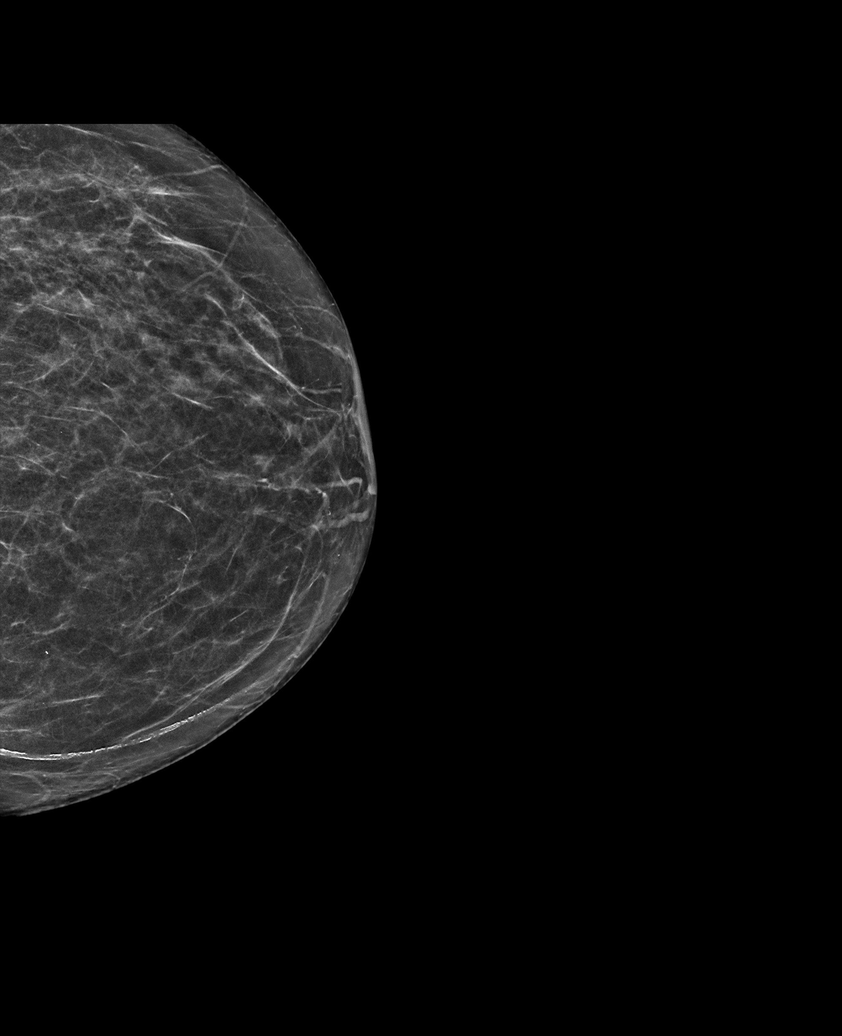

[L CC synth-2D (2 of 2)]
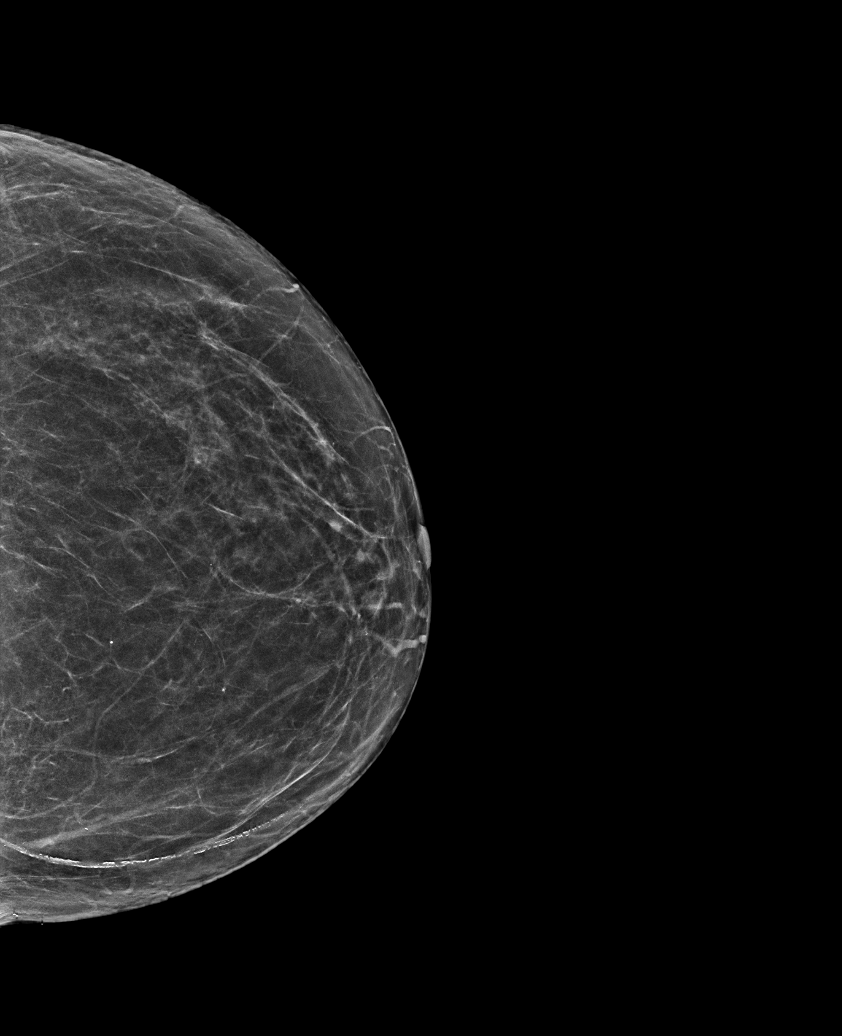

[R CC synth-2D]
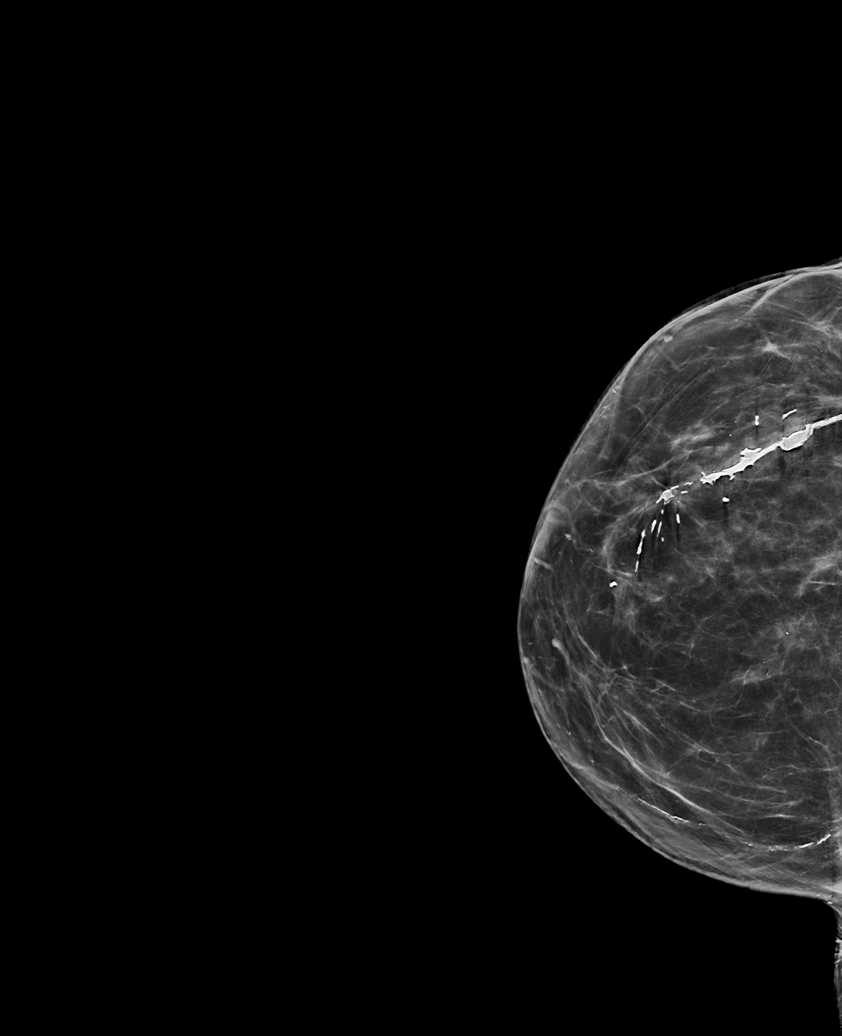

[L MLO synth-2D]
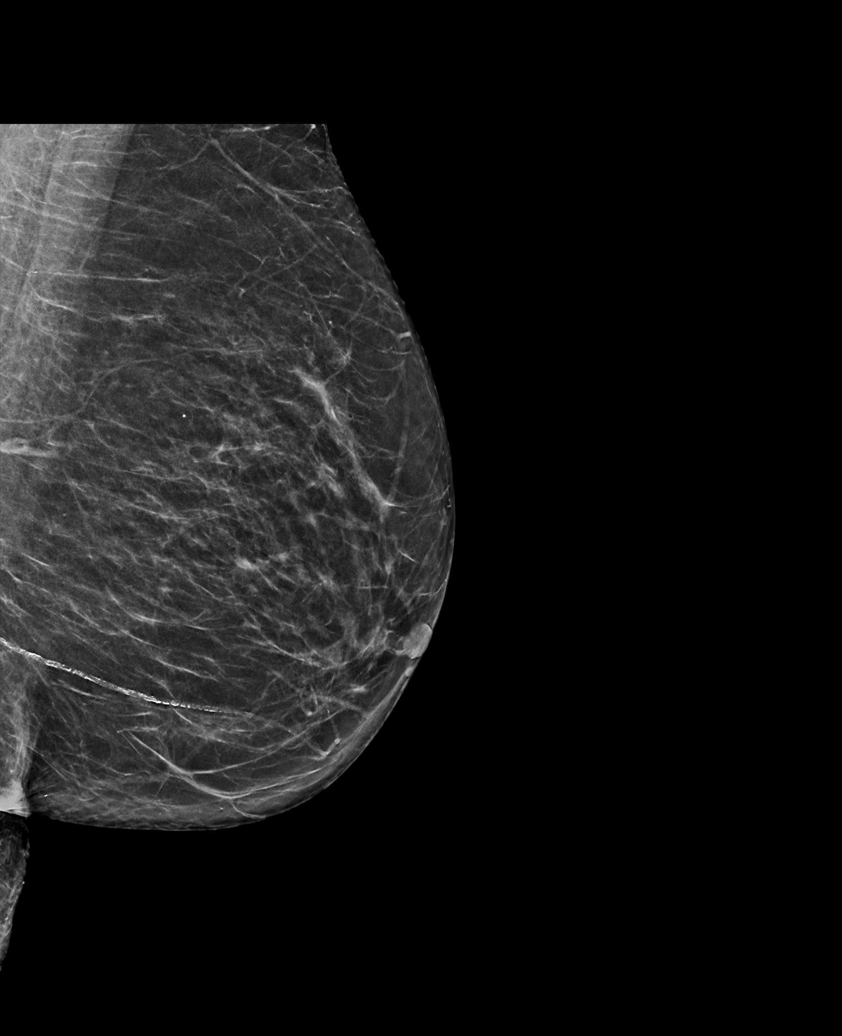

[L CC tomo · tomo slice 31/62.0]
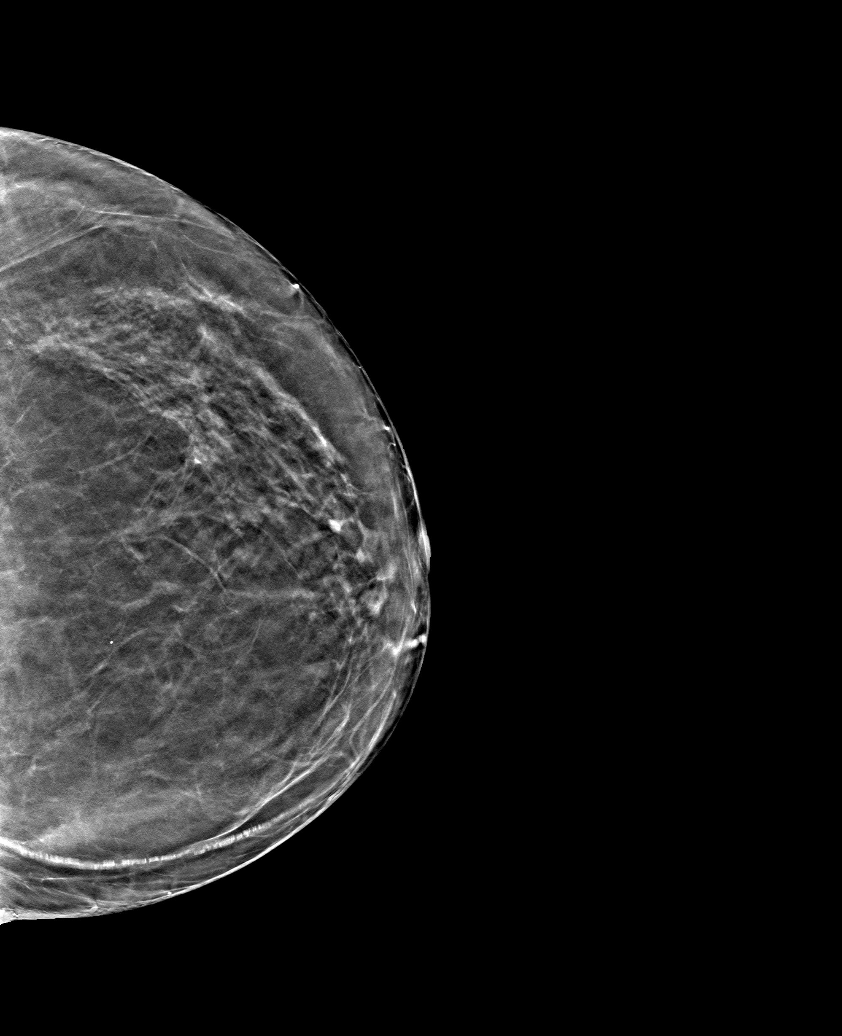

[6 of 30 positions shown; findings below may reference images not displayed]

ACR Breast Density Category b: There are scattered areas of
fibroglandular density.
FINDINGS: There are no findings suspicious for malignancy.
IMPRESSION: No mammographic evidence of malignancy. A result letter of this
screening mammogram will be mailed directly to the patient.

RECOMMENDATION:
Screening mammogram in one year. (Code:51-O-LD2)

BI-RADS CATEGORY  1: Negative.

## 2023-04-08 ENCOUNTER — Other Ambulatory Visit: Payer: Self-pay | Admitting: Internal Medicine

## 2023-04-08 DIAGNOSIS — Z1231 Encounter for screening mammogram for malignant neoplasm of breast: Secondary | ICD-10-CM

## 2023-05-20 ENCOUNTER — Ambulatory Visit
Admission: RE | Admit: 2023-05-20 | Discharge: 2023-05-20 | Disposition: A | Discharge status: Home / Self Care | Payer: Medicare Other | Source: Ambulatory Visit | Attending: Internal Medicine | Admitting: Internal Medicine

## 2023-05-20 DIAGNOSIS — Z1231 Encounter for screening mammogram for malignant neoplasm of breast: Secondary | ICD-10-CM

## 2024-04-14 ENCOUNTER — Other Ambulatory Visit: Payer: Self-pay | Admitting: Internal Medicine

## 2024-04-14 DIAGNOSIS — Z1231 Encounter for screening mammogram for malignant neoplasm of breast: Secondary | ICD-10-CM

## 2024-05-22 ENCOUNTER — Ambulatory Visit
Admission: RE | Admit: 2024-05-22 | Discharge: 2024-05-22 | Disposition: A | Discharge status: Home / Self Care | Source: Ambulatory Visit | Attending: Internal Medicine | Admitting: Internal Medicine

## 2024-05-22 DIAGNOSIS — Z1231 Encounter for screening mammogram for malignant neoplasm of breast: Secondary | ICD-10-CM

## 2024-07-31 ENCOUNTER — Other Ambulatory Visit (HOSPITAL_COMMUNITY): Payer: Self-pay | Admitting: Internal Medicine

## 2024-07-31 DIAGNOSIS — M810 Age-related osteoporosis without current pathological fracture: Secondary | ICD-10-CM | POA: Insufficient documentation

## 2024-08-07 ENCOUNTER — Telehealth (HOSPITAL_COMMUNITY): Payer: Self-pay | Admitting: Pharmacy Technician

## 2024-08-07 NOTE — Telephone Encounter (Addendum)
 NOTE: Per [facility] practice, MD approved biosimilars switch for Prolia per payor preferences.   Auth Submission: APPROVED Site of care: [facility] Payer: [employer] MEDICARE Medication & CPT/J Code(s) submitted: [id number] Jubbonti  Diagnosis Code: M81.0 Route of submission (phone, fax, portal):  Phone # Fax # Auth type: Buy/Bill HB Units/visits requested: 60mg  x 2 doses, q 6 months Reference number: [id number] Approval from: 08/07/24 to 08/07/25       Dagoberto Armour, CPhT [facility] Phone: [phone] 08/07/2024

## 2024-08-12 ENCOUNTER — Other Ambulatory Visit (HOSPITAL_COMMUNITY): Payer: Self-pay | Admitting: Internal Medicine

## 2024-08-27 ENCOUNTER — Ambulatory Visit (HOSPITAL_COMMUNITY)
Admission: RE | Admit: 2024-08-27 | Discharge: 2024-08-27 | Disposition: A | Discharge status: Home / Self Care | Source: Ambulatory Visit | Attending: Internal Medicine | Admitting: Internal Medicine

## 2024-08-27 VITALS — BP 144/75 | HR 71 | Temp 97.7°F | Resp 16

## 2024-08-27 DIAGNOSIS — M810 Age-related osteoporosis without current pathological fracture: Secondary | ICD-10-CM | POA: Diagnosis present

## 2024-08-27 MED ORDER — DENOSUMAB-BBDZ 60 MG/ML ~~LOC~~ SOSY
PREFILLED_SYRINGE | SUBCUTANEOUS | Status: AC
  Filled 2024-08-27: qty 1

## 2024-08-27 MED ORDER — DENOSUMAB-BBDZ 60 MG/ML ~~LOC~~ SOSY
60.0000 mg | PREFILLED_SYRINGE | Freq: Once | SUBCUTANEOUS | Status: AC
  Administered 2024-08-27: 60 mg via SUBCUTANEOUS

## 2024-08-27 MED ORDER — DENOSUMAB-BBDZ 60 MG/ML ~~LOC~~ SOSY
PREFILLED_SYRINGE | SUBCUTANEOUS | Status: AC
Start: 2024-08-27 — End: 2024-08-27
  Filled 2024-08-27: qty 1

## 2025-02-26 ENCOUNTER — Encounter (HOSPITAL_COMMUNITY)
